# Patient Record
Sex: Female | Born: 1965 | Race: White | Hispanic: No | Marital: Married | Smoking: Never smoker
Health system: Southern US, Community
[De-identification: ages and names within clinical notes are randomized; demographics above are authoritative.]

## PROBLEM LIST (undated history)

## (undated) DIAGNOSIS — M6289 Other specified disorders of muscle: Secondary | ICD-10-CM

## (undated) DIAGNOSIS — E039 Hypothyroidism, unspecified: Secondary | ICD-10-CM

## (undated) DIAGNOSIS — E871 Hypo-osmolality and hyponatremia: Secondary | ICD-10-CM

## (undated) HISTORY — PX: TONSILLECTOMY: SUR1361

## (undated) HISTORY — PX: LASER LAPAROSCOPY: SHX1952

## (undated) HISTORY — PX: ABDOMINAL HYSTERECTOMY: SHX81

## (undated) HISTORY — PX: COLONOSCOPY WITH ESOPHAGOGASTRODUODENOSCOPY (EGD): SHX5779

## (undated) HISTORY — PX: TUBAL LIGATION: SHX77

---

## 2008-07-02 DIAGNOSIS — E063 Autoimmune thyroiditis: Secondary | ICD-10-CM

## 2008-07-02 HISTORY — DX: Autoimmune thyroiditis: E06.3

## 2010-08-30 ENCOUNTER — Ambulatory Visit: Payer: Self-pay | Admitting: Unknown Physician Specialty

## 2010-09-01 LAB — PATHOLOGY REPORT

## 2010-09-28 ENCOUNTER — Ambulatory Visit: Payer: Self-pay | Admitting: Unknown Physician Specialty

## 2011-02-17 DIAGNOSIS — K589 Irritable bowel syndrome without diarrhea: Secondary | ICD-10-CM | POA: Insufficient documentation

## 2011-02-17 DIAGNOSIS — J301 Allergic rhinitis due to pollen: Secondary | ICD-10-CM | POA: Insufficient documentation

## 2011-05-22 DIAGNOSIS — E8940 Asymptomatic postprocedural ovarian failure: Secondary | ICD-10-CM | POA: Insufficient documentation

## 2012-02-11 DIAGNOSIS — M064 Inflammatory polyarthropathy: Secondary | ICD-10-CM | POA: Insufficient documentation

## 2013-12-23 DIAGNOSIS — R05 Cough: Secondary | ICD-10-CM | POA: Insufficient documentation

## 2013-12-23 DIAGNOSIS — R12 Heartburn: Secondary | ICD-10-CM | POA: Insufficient documentation

## 2013-12-23 DIAGNOSIS — R053 Chronic cough: Secondary | ICD-10-CM | POA: Insufficient documentation

## 2014-01-06 ENCOUNTER — Ambulatory Visit: Payer: Self-pay | Admitting: Gastroenterology

## 2014-03-03 ENCOUNTER — Ambulatory Visit: Payer: Self-pay | Admitting: Gastroenterology

## 2016-06-08 DIAGNOSIS — E559 Vitamin D deficiency, unspecified: Secondary | ICD-10-CM | POA: Insufficient documentation

## 2016-06-08 DIAGNOSIS — E039 Hypothyroidism, unspecified: Secondary | ICD-10-CM | POA: Insufficient documentation

## 2016-09-21 DIAGNOSIS — E871 Hypo-osmolality and hyponatremia: Secondary | ICD-10-CM | POA: Insufficient documentation

## 2017-07-31 DIAGNOSIS — E079 Disorder of thyroid, unspecified: Secondary | ICD-10-CM | POA: Insufficient documentation

## 2017-07-31 DIAGNOSIS — R5382 Chronic fatigue, unspecified: Secondary | ICD-10-CM | POA: Insufficient documentation

## 2017-07-31 DIAGNOSIS — M533 Sacrococcygeal disorders, not elsewhere classified: Secondary | ICD-10-CM | POA: Insufficient documentation

## 2017-07-31 DIAGNOSIS — R6889 Other general symptoms and signs: Secondary | ICD-10-CM | POA: Insufficient documentation

## 2017-07-31 DIAGNOSIS — R634 Abnormal weight loss: Secondary | ICD-10-CM | POA: Insufficient documentation

## 2017-07-31 DIAGNOSIS — R768 Other specified abnormal immunological findings in serum: Secondary | ICD-10-CM | POA: Insufficient documentation

## 2017-11-22 ENCOUNTER — Other Ambulatory Visit: Payer: Self-pay | Admitting: Physical Medicine and Rehabilitation

## 2017-11-22 DIAGNOSIS — M5416 Radiculopathy, lumbar region: Secondary | ICD-10-CM

## 2017-12-06 ENCOUNTER — Ambulatory Visit
Admission: RE | Admit: 2017-12-06 | Discharge: 2017-12-06 | Disposition: A | Discharge status: Home / Self Care | Payer: Federal, State, Local not specified - PPO | Source: Ambulatory Visit | Attending: Physical Medicine and Rehabilitation | Admitting: Physical Medicine and Rehabilitation

## 2017-12-06 ENCOUNTER — Ambulatory Visit: Payer: Self-pay

## 2017-12-06 DIAGNOSIS — M5416 Radiculopathy, lumbar region: Secondary | ICD-10-CM

## 2017-12-10 ENCOUNTER — Ambulatory Visit: Payer: Self-pay

## 2018-05-19 DIAGNOSIS — J328 Other chronic sinusitis: Secondary | ICD-10-CM | POA: Insufficient documentation

## 2018-10-09 ENCOUNTER — Telehealth: Payer: Self-pay

## 2018-10-09 NOTE — Telephone Encounter (Signed)
Called pt to pre-chart for her e-visit with Dr. Robbie Lis to contact LVM to return call

## 2018-10-13 ENCOUNTER — Telehealth: Payer: Self-pay

## 2018-10-13 ENCOUNTER — Ambulatory Visit: Payer: Federal, State, Local not specified - PPO | Admitting: Gastroenterology

## 2018-10-13 ENCOUNTER — Ambulatory Visit (INDEPENDENT_AMBULATORY_CARE_PROVIDER_SITE_OTHER): Payer: Federal, State, Local not specified - PPO | Admitting: Gastroenterology

## 2018-10-13 DIAGNOSIS — K581 Irritable bowel syndrome with constipation: Secondary | ICD-10-CM | POA: Diagnosis not present

## 2018-10-13 DIAGNOSIS — R140 Abdominal distension (gaseous): Secondary | ICD-10-CM | POA: Diagnosis not present

## 2018-10-13 NOTE — Telephone Encounter (Signed)
Called Vanessa Gillespie to discuss Dr. Johnney Killian directions to give Vanessa Gillespie Trulance samples  Unable to contact LVM to return call

## 2018-10-13 NOTE — Telephone Encounter (Signed)
Patient called back after missing call.

## 2018-10-13 NOTE — Telephone Encounter (Signed)
Called pt to pre-chart for her e-visit today with Dr. Robbie Lis to contact LVM to return call

## 2018-10-13 NOTE — Telephone Encounter (Signed)
Pt has been informed of Dr. Johnney Killian directions to pick up Trulance samples

## 2018-10-13 NOTE — Progress Notes (Signed)
Vanessa MoodKiran Nioma Gillespie  [address]  [address]  [place], [place] [id number]  Main: [phone]  Fax: [phone]   Gastroenterology Consultation  Referring Provider:     Marlana SalvageShapely-Quinn, Todd Wes* Primary Care Physician:  Nonda LouShapely-Quinn, Todd [place], MD Reason for Consultation:     IBS        HPI:   Virtual Visit via video  Note  I connected with patient on 10/13/18 at 11:00 AM EDT by video  and verified that I am speaking with the correct person using two identifiers.   I discussed the limitations, risks, security and privacy concerns of performing an evaluation and management service by video and the availability of in person appointments. I also discussed with the patient that there may be a patient responsible charge related to this service. The patient expressed understanding and agreed to proceed.  Location of the patient: Home Location of provider: Home Participating persons: Patient and provider only   History of Present Illness: Chief Complaint  Patient presents with  . Irritable Bowel Syndrome     Vanessa Gillespie is a 53 y.o. y/o female referred for consultation & management  by Dr. Perley JainShapely-Quinn, Desiree Lucyodd [place], MD.    She says that she has had IBS-Lately constipation, has gone back and forth with diarrhea for a long time and last year was diagnosed with a "weak pelvic floor" , working with PT, still very weak, she tried a lot of Rx but cant find one that works " all the time", she weights 93 lbs and says she is working with nutrition and tell that when she eats during the day geta a lot of bloating and cant pass the gas. Tried gasex and IB guard which has not worked.   Says in the center of her abdomen has a lot of pressure.   Presently she has a bowel movement only when she takes motegrity daily , MOM, 2 tbsp daily -with this has a bowel movement twice week, bloating is worse when she has not had a bowel movement. When she passes gas is foul smelling.No artificial  sugars, no sodas, chewing gum. No coffee mate.   Tried linzess caused explosive diarrhea in the beginning along with motegrity - she then stopped motegrity and linzess stopped working. Long time back tried [place].   She has had a hysterectomy , 2 kids , vaginal deliveries, large tear with second baby. Sometimes passing urine is hard. No bulging noted from her vagina. 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 Lot of gurgling sounds in her belly . Worse 3-4 hours after eating.   No past medical history on file.    Prior to Admission medications   Medication Sig Start Date End Date Taking? Authorizing Provider  doxepin (SINEQUAN) 10 MG capsule  10/12/18  Yes [provider]  estradiol (CLIMARA - DOSED IN MG/24 HR) 0.075 mg/24hr patch Place onto the skin.   Yes [provider]  levothyroxine (SYNTHROID, LEVOTHROID) 50 MCG tablet Take by mouth. 09/14/18 12/13/18 Yes [provider]  polyethylene glycol (MIRALAX / GLYCOLAX) 17 g packet Take by mouth.   Yes [provider]  URE-NA 15 g PACK USE TAKE 15 GRAMS BY MOUTH ONCE DAILY 08/11/18  Yes [provider]    No family history on file.   Social History   Tobacco Use  . Smoking status: Not on file  Substance Use Topics  . Alcohol use: Not on file  . Drug use: Not  on file    Allergies as of 10/13/2018  . (No Known Allergies)    Review of Systems:    All systems reviewed and negative except where noted in HPI. General Appearance:    Alert, cooperative, no distress, appears stated age  Head:    Normocephalic, without obvious abnormality, atraumatic  Eyes:    PERRL, conjunctiva/corneas clear,  Ears:    Grossly normal hearing    Neurologic:   Grossly appears normal     Observations/Objective:  Labs: CBC No results found for: WBC, RBC, HGB, HCT, PLT, MCV, MCH, MCHC, RDW, LYMPHSABS, MONOABS, EOSABS, BASOSABS CMP  No results found for: NA, K, CL, CO2, GLUCOSE, BUN, CREATININE,  CALCIUM, PROT, ALBUMIN, AST, ALT, ALKPHOS, BILITOT, GFRNONAA, GFRAA  Imaging Studies: No results found.  Assessment and Plan:   Vanessa Gillespie is a 53 y.o. y/o female has been referred for IBS-predominently constipation, H/o hysterectomy and vaginal tear during child birth, in addition states she has a vaginal prolapse- all of these likely contributing to constipation. In addition has features of possible SIBO    Plan :   1. Low FODMAP diet  2. Trial of activatec charcoal 3. Stop motegrity and MOM- start Trulance 4. If no better in 2 weeks, check TSH,celiac serology and discuss colonoscopy   Follow Up Instructions:   I discussed the assessment and treatment plan with the patient. The patient was provided an opportunity to ask questions and all were answered. The patient agreed with the plan and demonstrated an understanding of the instructions.   The patient was advised to call back or seek an in-person evaluation if the symptoms worsen or if the condition fails to improve as anticipated.  I provided 25 minutes of face-to-face time during this encounter.   Dr Vanessa Mood MD,MRCP [facility]) Gastroenterology/Hepatology Pager: [phone]   Speech recognition software was used to dictate the above note.

## 2018-10-20 ENCOUNTER — Other Ambulatory Visit: Payer: Self-pay

## 2018-10-20 MED ORDER — LACTULOSE 10 GM/15ML PO SOLN: 10.0000 g | Freq: Two times a day (BID) | ORAL | 0 refills | Status: DC

## 2018-10-23 NOTE — Telephone Encounter (Signed)
Vanessa Gillespie is OTC - can get at any store like [facility] or [facility]- 17.2 mg once daily   Change follow up to 6 weeks from now    Dr Tobi Bastos

## 2018-10-24 ENCOUNTER — Telehealth: Payer: Self-pay | Admitting: Gastroenterology

## 2018-10-24 NOTE — Telephone Encounter (Signed)
Patient called & Dr Tobi Bastos was told to buy SENNA  For constipation. She purchased Senokot . Is this correct. What doseage  Should she take. Please call & advise.

## 2018-10-24 NOTE — Telephone Encounter (Signed)
Spoke with pt and explained Dr. Johnney Killian directions for taking the Senna(Senokot). Pt understands.

## 2018-10-27 ENCOUNTER — Ambulatory Visit: Payer: Federal, State, Local not specified - PPO | Admitting: Gastroenterology

## 2018-11-03 ENCOUNTER — Other Ambulatory Visit: Payer: Self-pay

## 2018-11-03 MED ORDER — LINACLOTIDE 145 MCG PO CAPS: 145.0000 ug | ORAL_CAPSULE | Freq: Every day | ORAL | 3 refills | Status: DC

## 2018-11-04 ENCOUNTER — Telehealth: Payer: Self-pay | Admitting: Gastroenterology

## 2018-11-04 NOTE — Telephone Encounter (Signed)
[employer] faxed from the [facility] Clinical Call Center message: The Prior Authorization request has been approved for linaclotide (LINZESS) 145 MCG CAPS capsule.The authorization is valid from 10/04/2018 through 11/03/2019. Copy on Vanessa Gillespie's desk.

## 2018-11-04 NOTE — Telephone Encounter (Signed)
Pt left vm she states she wanted to pick up a sample.(pt did not leave more information requests a call in vm)

## 2018-11-11 ENCOUNTER — Ambulatory Visit: Payer: Federal, State, Local not specified - PPO | Admitting: Gastroenterology

## 2018-11-27 ENCOUNTER — Other Ambulatory Visit: Payer: Self-pay

## 2018-11-27 ENCOUNTER — Telehealth: Payer: Self-pay

## 2018-11-27 DIAGNOSIS — Z1211 Encounter for screening for malignant neoplasm of colon: Secondary | ICD-10-CM

## 2018-11-27 MED ORDER — NA SULFATE-K SULFATE-MG SULF 17.5-3.13-1.6 GM/177ML PO SOLN: 1.0000 | Freq: Once | ORAL | 0 refills | Status: AC

## 2018-11-27 NOTE — Progress Notes (Signed)
Pt has been scheduled for a colonoscopy procedure. Pt is aware of requirements to have the COVID-19 test performed 4 days prior to the scheduled procedure. Pt is aware of testing site location, day, and time.

## 2018-11-27 NOTE — Telephone Encounter (Signed)
Spoke with pt and was able to schedule procedure. 

## 2018-11-27 NOTE — Telephone Encounter (Signed)
Called pt to schedule colonoscopy.  Unable to contact, LVM to return call 

## 2018-11-27 NOTE — Telephone Encounter (Signed)
Pt is calling to schedule a colonoscopy  Please call cell #

## 2018-11-27 NOTE — Telephone Encounter (Signed)
-----   Message from [name], MD sent at 11/27/2018  9:40 AM EDT ----- Regarding: colonoscopy Please schedule screening colonoscopy for her

## 2018-12-02 NOTE — Telephone Encounter (Signed)
Spoke with pt and informed her that we do not have a urogynecologist within [facility], pt states she's okay with going back to her original urogynecologist. I also informed pt that we have a Suprep bowel kit sample ready for her to pick up here at the office.

## 2018-12-08 ENCOUNTER — Ambulatory Visit: Payer: Federal, State, Local not specified - PPO | Admitting: Gastroenterology

## 2018-12-09 ENCOUNTER — Telehealth: Payer: Self-pay

## 2018-12-09 ENCOUNTER — Other Ambulatory Visit: Payer: Self-pay

## 2018-12-09 DIAGNOSIS — Z01812 Encounter for preprocedural laboratory examination: Secondary | ICD-10-CM

## 2018-12-09 NOTE — Telephone Encounter (Signed)
Spoke with pt and informed her of Dr. Georgeann Oppenheim instructions for pt to [facility] lab test to check sodium levels 2 days prior to procedure. Pt agrees and is aware she'll need to visit her local [facility] collection site.

## 2018-12-09 NOTE — Telephone Encounter (Signed)
-----   Message from Jonathon Bellows, MD sent at 12/08/2018 11:16 AM EDT ----- Regarding: please arrange appointment  Vanessa Gillespie  Advise her to have her BMP checked 2 days prior to procedure to check sodium levels    Dr Jonathon Bellows  Gastroenterology/Hepatology Pager: [phone]

## 2018-12-12 ENCOUNTER — Other Ambulatory Visit: Payer: Self-pay

## 2018-12-12 ENCOUNTER — Other Ambulatory Visit
Admission: RE | Admit: 2018-12-12 | Discharge: 2018-12-12 | Disposition: A | Discharge status: Home / Self Care | Payer: Federal, State, Local not specified - PPO | Source: Home / Self Care | Attending: Gastroenterology | Admitting: Gastroenterology

## 2018-12-12 ENCOUNTER — Other Ambulatory Visit
Admission: RE | Admit: 2018-12-12 | Discharge: 2018-12-12 | Disposition: A | Discharge status: Home / Self Care | Payer: Federal, State, Local not specified - PPO | Source: Ambulatory Visit | Attending: Gastroenterology | Admitting: Gastroenterology

## 2018-12-12 DIAGNOSIS — Z01812 Encounter for preprocedural laboratory examination: Secondary | ICD-10-CM | POA: Insufficient documentation

## 2018-12-12 DIAGNOSIS — Z1159 Encounter for screening for other viral diseases: Secondary | ICD-10-CM | POA: Diagnosis not present

## 2018-12-12 LAB — BASIC METABOLIC PANEL
Anion gap: 8 (ref 5–15)
BUN: 39 mg/dL — ABNORMAL HIGH (ref 6–20)
CO2: 28 mmol/L (ref 22–32)
Calcium: 9.2 mg/dL (ref 8.9–10.3)
Chloride: 99 mmol/L (ref 98–111)
Creatinine, Ser: 0.68 mg/dL (ref 0.44–1.00)
GFR calc Af Amer: >60 mL/min (ref >60)
GFR calc non Af Amer: >60 mL/min (ref >60)
Glucose, Bld: 92 mg/dL (ref 70–99)
Potassium: 4.7 mmol/L (ref 3.5–5.1)
Sodium: 135 mmol/L (ref 135–145)

## 2018-12-13 LAB — NOVEL CORONAVIRUS, NAA (HOSP ORDER, SEND-OUT TO REF LAB; TAT 18-24 HRS): SARS-CoV-2, NAA: NOT DETECTED

## 2018-12-15 ENCOUNTER — Encounter: Payer: Self-pay | Admitting: Gastroenterology

## 2018-12-15 NOTE — Telephone Encounter (Signed)
Spoke with pt and informed her that her sodium levels are normal and Dr. Vicente Males says pt is okay to proceed with her scheduled endoscopy procedure.

## 2018-12-16 ENCOUNTER — Other Ambulatory Visit: Payer: Self-pay

## 2018-12-16 ENCOUNTER — Ambulatory Visit: Payer: Federal, State, Local not specified - PPO | Admitting: Anesthesiology

## 2018-12-16 ENCOUNTER — Ambulatory Visit
Admission: RE | Admit: 2018-12-16 | Discharge: 2018-12-16 | Disposition: A | Discharge status: Home / Self Care | Payer: Federal, State, Local not specified - PPO | Attending: Gastroenterology | Admitting: Gastroenterology

## 2018-12-16 ENCOUNTER — Encounter
Admission: RE | Disposition: A | Discharge status: Home / Self Care | Payer: Self-pay | Source: Home / Self Care | Attending: Gastroenterology

## 2018-12-16 ENCOUNTER — Encounter: Payer: Self-pay | Admitting: *Deleted

## 2018-12-16 DIAGNOSIS — Z79890 Hormone replacement therapy (postmenopausal): Secondary | ICD-10-CM | POA: Diagnosis not present

## 2018-12-16 DIAGNOSIS — Z09 Encounter for follow-up examination after completed treatment for conditions other than malignant neoplasm: Secondary | ICD-10-CM | POA: Insufficient documentation

## 2018-12-16 DIAGNOSIS — Z860100 Personal history of colon polyps, unspecified: Secondary | ICD-10-CM

## 2018-12-16 DIAGNOSIS — Z1211 Encounter for screening for malignant neoplasm of colon: Secondary | ICD-10-CM

## 2018-12-16 DIAGNOSIS — Z79899 Other long term (current) drug therapy: Secondary | ICD-10-CM | POA: Insufficient documentation

## 2018-12-16 HISTORY — DX: Other specified disorders of muscle: M62.89

## 2018-12-16 HISTORY — PX: COLONOSCOPY WITH PROPOFOL: SHX5780

## 2018-12-16 HISTORY — DX: Hypo-osmolality and hyponatremia: E87.1

## 2018-12-16 HISTORY — DX: Hypothyroidism, unspecified: E03.9

## 2018-12-16 SURGERY — COLONOSCOPY WITH PROPOFOL
Anesthesia: General

## 2018-12-16 MED ORDER — MIDAZOLAM HCL 2 MG/2ML IJ SOLN
INTRAMUSCULAR | Status: DC | PRN
  Administered 2018-12-16: 2 mg via INTRAVENOUS

## 2018-12-16 MED ORDER — MIDAZOLAM HCL 2 MG/2ML IJ SOLN
INTRAMUSCULAR | Status: AC
  Filled 2018-12-16: qty 2

## 2018-12-16 MED ORDER — PROPOFOL 500 MG/50ML IV EMUL
INTRAVENOUS | Status: DC | PRN
  Administered 2018-12-16: 100 ug/kg/min via INTRAVENOUS

## 2018-12-16 MED ORDER — PROPOFOL 500 MG/50ML IV EMUL
INTRAVENOUS | Status: AC
  Filled 2018-12-16: qty 50

## 2018-12-16 MED ORDER — EPHEDRINE SULFATE 50 MG/ML IJ SOLN
INTRAMUSCULAR | Status: AC
  Filled 2018-12-16: qty 1

## 2018-12-16 MED ORDER — SODIUM CHLORIDE 0.9 % IV SOLN
INTRAVENOUS | Status: DC
  Administered 2018-12-16: 07:00:00 via INTRAVENOUS

## 2018-12-16 MED ORDER — FENTANYL CITRATE (PF) 100 MCG/2ML IJ SOLN
INTRAMUSCULAR | Status: DC | PRN
  Administered 2018-12-16: 50 ug via INTRAVENOUS

## 2018-12-16 MED ORDER — EPHEDRINE SULFATE 50 MG/ML IJ SOLN
INTRAMUSCULAR | Status: DC | PRN
  Administered 2018-12-16 (×2): 5 mg via INTRAVENOUS

## 2018-12-16 MED ORDER — FENTANYL CITRATE (PF) 100 MCG/2ML IJ SOLN
INTRAMUSCULAR | Status: AC
  Filled 2018-12-16: qty 2

## 2018-12-16 NOTE — Transfer of Care (Signed)
Immediate Anesthesia Transfer of Care Note  Patient: Vanessa Gillespie  Procedure(s) Performed: COLONOSCOPY WITH PROPOFOL (N/A )  Patient Location: PACU  Anesthesia Type:General  Level of Consciousness: awake and sedated  Airway & Oxygen Therapy: Patient Spontanous Breathing and Patient connected to nasal cannula oxygen  Post-op Assessment: Report given to RN and Post -op Vital signs reviewed and stable  Post vital signs: Reviewed and stable  Last Vitals:  Vitals Value Taken Time  BP 104/66 12/16/18 0837  Temp    Pulse 67 12/16/18 0838  Resp 18 12/16/18 0838  SpO2 100 % 12/16/18 0838  Vitals shown include unvalidated device data.  Last Pain:  Vitals:   12/16/18 0706  TempSrc: Tympanic  PainSc: 0-No pain         Complications: No apparent anesthesia complications

## 2018-12-16 NOTE — H&P (Signed)
Jonathon Bellows, MD [address], [place], [place], [place], [address] [address], [place], [place], [place], [address] Phone: [phone]  Fax: [phone]  Primary Care Physician:  Cecile Sheerer, MD   Pre-Procedure History & Physical: HPI:  Vanessa Gillespie is a 53 y.o. female is here for an colonoscopy.   Past Medical History:  Diagnosis Date  . Hyponatremia   . Hypothyroidism   . Pelvic floor dysfunction     Past Surgical History:  Procedure Laterality Date  . ABDOMINAL HYSTERECTOMY    . COLONOSCOPY WITH ESOPHAGOGASTRODUODENOSCOPY (EGD)    . LASER LAPAROSCOPY    . TONSILLECTOMY    . TUBAL LIGATION      Prior to Admission medications   Medication Sig Start Date End Date Taking? Authorizing Provider  doxepin (SINEQUAN) 10 MG capsule  10/12/18  Yes [provider]  estradiol (CLIMARA - DOSED IN MG/24 HR) 0.075 mg/24hr patch Place onto the skin.   Yes [provider]  lactulose (CHRONULAC) 10 GM/15ML solution Take 15 mLs (10 g total) by mouth 2 (two) times daily. 10/20/18  Yes Jonathon Bellows, MD  linaclotide [facility]) 145 MCG CAPS capsule Take 1 capsule (145 mcg total) by mouth daily before breakfast. 11/03/18  Yes Jonathon Bellows, MD  polyethylene glycol (MIRALAX / GLYCOLAX) 17 g packet Take by mouth.   Yes [provider]  URE-NA 15 g PACK USE TAKE 15 GRAMS BY MOUTH ONCE DAILY 08/11/18  Yes [provider]  levothyroxine (SYNTHROID, LEVOTHROID) 50 MCG tablet Take by mouth. 09/14/18 12/13/18  [provider]    Allergies as of 11/27/2018  . (No Known Allergies)    History reviewed. No pertinent family history.  Social History   Socioeconomic History  . Marital status: Married    Spouse name: Not on file  . Number of children: Not on file  . Years of education: Not on file  . Highest education level: Not on file  Occupational History  . Not on file  Social Needs  . Financial resource strain: Not on file  . Food  insecurity    Worry: Not on file    Inability: Not on file  . Transportation needs    Medical: Not on file    Non-medical: Not on file  Tobacco Use  . Smoking status: Not on file  Substance and Sexual Activity  . Alcohol use: Not on file  . Drug use: Not on file  . Sexual activity: Not on file  Lifestyle  . Physical activity    Days per week: Not on file    Minutes per session: Not on file  . Stress: Not on file  Relationships  . Social Herbalist on phone: Not on file    Gets together: Not on file    Attends religious service: Not on file    Active member of club or organization: Not on file    Attends meetings of clubs or organizations: Not on file    Relationship status: Not on file  . Intimate partner violence    Fear of current or ex partner: Not on file    Emotionally abused: Not on file    Physically abused: Not on file    Forced sexual activity: Not on file  Other Topics Concern  . Not on file  Social History Narrative  . Not on file    Review of Systems: See HPI, otherwise negative ROS  Physical Exam: BP 115/69  Pulse (!) 57   Temp (!) 96.1 F (35.6 C) (Tympanic)   Resp 18   Ht 5\' 2"  (1.575 m)   Wt 42.2 kg   LMP 07/02/2018   SpO2 100%   BMI 17.01 kg/m  General:   Alert,  pleasant and cooperative in NAD Head:  Normocephalic and atraumatic. Neck:  Supple; no masses or thyromegaly. Lungs:  Clear throughout to auscultation, normal respiratory effort.    Heart:  +S1, +S2, Regular rate and rhythm, No edema. Abdomen:  Soft, nontender and nondistended. Normal bowel sounds, without guarding, and without rebound.   Neurologic:  Alert and  oriented x4;  grossly normal neurologically.  Impression/Plan: Vanessa Gillespie is here for an colonoscopy to be performed for surveillance due to prior history of colon polyps   Risks, benefits, limitations, and alternatives regarding  colonoscopy have been reviewed with the patient.  Questions have been  answered.  All parties agreeable.   Wyline MoodKiran Bettylee Feig, MD  12/16/2018, 8:07 AM

## 2018-12-16 NOTE — Anesthesia Procedure Notes (Signed)
Performed by: Cook-Martin, Kasandra Fehr Pre-anesthesia Checklist: Patient identified, Emergency Drugs available, Suction available, Patient being monitored and Timeout performed Patient Re-evaluated:Patient Re-evaluated prior to induction Oxygen Delivery Method: Nasal cannula Preoxygenation: Pre-oxygenation with 100% oxygen Induction Type: IV induction Placement Confirmation: positive ETCO2 and CO2 detector       

## 2018-12-16 NOTE — Anesthesia Postprocedure Evaluation (Signed)
Anesthesia Post Note  Patient: [name]  Procedure(s) Performed: COLONOSCOPY WITH PROPOFOL (N/A )  Patient location during evaluation: PACU Anesthesia Type: General Level of consciousness: awake and alert Pain management: pain level controlled Vital Signs Assessment: post-procedure vital signs reviewed and stable Respiratory status: spontaneous breathing, nonlabored ventilation and respiratory function stable Cardiovascular status: blood pressure returned to baseline and stable Postop Assessment: no apparent nausea or vomiting Anesthetic complications: no     Last Vitals:  Vitals:   12/16/18 0900 12/16/18 0910  BP: 106/67 108/68  Pulse: 64 (!) 59  Resp: 16 14  Temp:    SpO2: 100% 100%    Last Pain:  Vitals:   12/16/18 0830  TempSrc: Tympanic  PainSc:                  Durenda Hurt

## 2018-12-16 NOTE — Anesthesia Post-op Follow-up Note (Signed)
Anesthesia QCDR form completed.        

## 2018-12-16 NOTE — Anesthesia Preprocedure Evaluation (Addendum)
Anesthesia Evaluation  Patient identified by MRN, date of birth, ID band Patient awake    Reviewed: Allergy & Precautions, H&P , NPO status , Patient's Chart, lab work & pertinent test results  Airway Mallampati: II  TM Distance: >3 FB     Dental  (+) Teeth Intact   Pulmonary neg pulmonary ROS,           Cardiovascular negative cardio ROS       Neuro/Psych negative neurological ROS  negative psych ROS   GI/Hepatic negative GI ROS, Neg liver ROS,   Endo/Other  Hypothyroidism   Renal/GU negative Renal ROS  negative genitourinary   Musculoskeletal  (+) Arthritis ,   Abdominal   Peds  Hematology negative hematology ROS (+)   Anesthesia Other Findings Past Medical History: No date: Hyponatremia No date: Hypothyroidism No date: Pelvic floor dysfunction  Past Surgical History: No date: ABDOMINAL HYSTERECTOMY No date: COLONOSCOPY WITH ESOPHAGOGASTRODUODENOSCOPY (EGD) No date: LASER LAPAROSCOPY No date: TONSILLECTOMY No date: TUBAL LIGATION  BMI    Body Mass Index: 17.01 kg/m      Reproductive/Obstetrics negative OB ROS                            Anesthesia Physical Anesthesia Plan  ASA: II  Anesthesia Plan: General   Post-op Pain Management:    Induction:   PONV Risk Score and Plan: Propofol infusion and TIVA  Airway Management Planned: Natural Airway and Nasal Cannula  Additional Equipment:   Intra-op Plan:   Post-operative Plan:   Informed Consent: I have reviewed the patients History and Physical, chart, labs and discussed the procedure including the risks, benefits and alternatives for the proposed anesthesia with the patient or authorized representative who has indicated his/her understanding and acceptance.     Dental Advisory Given  Plan Discussed with: Anesthesiologist and CRNA  Anesthesia Plan Comments:         Anesthesia Quick Evaluation

## 2018-12-16 NOTE — Op Note (Signed)
[facility] Gastroenterology Patient Name: Vanessa BergeronMelissa Gillespie Procedure Date: 12/16/2018 8:08 AM MRN: [id number] Account #: [id number] Date of Birth: 1965-10-13 Admit Type: Outpatient Age: 53 [facility] 3 Gender: Female Note Status: Finalized Procedure:            Colonoscopy Indications:          High risk colon cancer surveillance: Personal history                        of colonic polyps Providers:            Wyline MoodKiran Ramonda Galyon MD, MD Referring MD:         No Local Md, MD (Referring MD) Medicines:            Monitored Anesthesia Care Complications:        No immediate complications. Procedure:            Pre-Anesthesia Assessment:                       - Prior to the procedure, a History and Physical was                        performed, and patient medications, allergies and                        sensitivities were reviewed. The patient's tolerance of                        previous anesthesia was reviewed.                       - The risks and benefits of the procedure and the                        sedation options and risks were discussed with the                        patient. All questions were answered and informed                        consent was obtained.                       - ASA Grade Assessment: II - A patient with mild                        systemic disease.                       After obtaining informed consent, the colonoscope was                        passed under direct vision. Throughout the procedure,                        the patient's blood pressure, pulse, and oxygen                        saturations were monitored continuously. The  Colonoscope was introduced through the anus and                        advanced to the the cecum, identified by the                        appendiceal orifice, IC valve and transillumination.                        The colonoscopy was performed without difficulty. The                 patient tolerated the procedure well. The quality of                        the bowel preparation was adequate. Findings:      The perianal exam findings include poor anal tone      The entire examined colon appeared normal on direct and retroflexion       views. Impression:           - Abnormal perianal exam.                       - The entire examined colon is normal on direct and                        retroflexion views.                       - No specimens collected. Recommendation:       - Discharge patient to home (with escort).                       - Resume previous diet.                       - Continue present medications.                       - Repeat colonoscopy in 5 years for surveillance.                       - Return to my office in 6 weeks. Procedure Code(s):    --- Professional ---                       [phone], Colonoscopy, flexible; diagnostic, including                        collection of specimen(s) by brushing or washing, when                        performed (separate procedure) Diagnosis Code(s):    --- Professional ---                       Z86.010, Personal history of colonic polyps CPT copyright 2019 American Medical Association. All rights reserved. The codes documented in this report are preliminary and upon coder review may  be revised to meet current compliance requirements. Wyline MoodKiran Dossie Swor, MD Wyline MoodKiran Johnel Yielding MD, MD 12/16/2018 8:35:02 AM This report has been signed electronically. Number of Addenda: 0 Note Initiated On: 12/16/2018 8:08 AM Scope Withdrawal Time: 0 hours 14 minutes  56 seconds  Total Procedure Duration: 0 hours 21 minutes 33 seconds  Estimated Blood Loss: Estimated blood loss: none.      [facility]

## 2018-12-17 ENCOUNTER — Encounter: Payer: Self-pay | Admitting: Gastroenterology

## 2018-12-25 ENCOUNTER — Ambulatory Visit: Payer: Federal, State, Local not specified - PPO | Admitting: Gastroenterology

## 2019-01-26 ENCOUNTER — Other Ambulatory Visit: Payer: Self-pay

## 2019-01-26 ENCOUNTER — Telehealth: Payer: Self-pay | Admitting: Gastroenterology

## 2019-01-26 ENCOUNTER — Ambulatory Visit: Payer: Federal, State, Local not specified - PPO | Admitting: Gastroenterology

## 2019-01-26 MED ORDER — LINACLOTIDE 145 MCG PO CAPS: 145.0000 ug | ORAL_CAPSULE | Freq: Every day | ORAL | 5 refills | Status: DC

## 2019-01-26 NOTE — Telephone Encounter (Signed)
Pt left vm she received a message from [facility] stating when her last refill was does she need to do anything please call pt to clarify

## 2019-04-01 NOTE — Telephone Encounter (Signed)
Spoke with pt and informed her that once Dr. Vicente Males signs the form it'll be ready for her to pick up Thursday afternoon. Pt agrees to stop by our office between 3-5 pm.

## 2019-04-21 NOTE — Telephone Encounter (Signed)
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  One of the features of irritable bowel syndrome with constipation is that it does not cause weight loss by itself.  I have not evaluated her for her weight loss from the GI point of view.  If that is something she and her primary care physician feel that needs evaluation and is related to her GI tract then it would require further discussion to decide if she has had all the appropriate testing to rule out other causes of weight loss.   Can you also send the primary care physician a copy of this above message

## 2019-04-30 ENCOUNTER — Other Ambulatory Visit: Payer: Self-pay | Admitting: Gastroenterology

## 2019-06-08 NOTE — Telephone Encounter (Signed)
1. Give samples of motegrity  2. Yes script can be sent to [place] if it works

## 2019-06-09 ENCOUNTER — Other Ambulatory Visit: Payer: Self-pay

## 2019-06-09 MED ORDER — MOTEGRITY 2 MG PO TABS: 2.0000 mg | ORAL_TABLET | Freq: Every day | ORAL | 3 refills | Status: DC

## 2019-06-09 NOTE — Telephone Encounter (Signed)
Hi sent a message to [facility] yesterday.   Yes can try motegrity, give samples. If does work will send script to [place]

## 2019-06-11 MED ORDER — MOTEGRITY 2 MG PO TABS: 2.0000 mg | ORAL_TABLET | Freq: Every day | ORAL | 3 refills | Status: DC

## 2019-06-11 NOTE — Addendum Note (Signed)
Addended by: Dorethea Clan on: 06/11/2019 11:43 AM   Modules accepted: Orders

## 2019-06-11 NOTE — Telephone Encounter (Signed)
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

## 2019-07-06 DIAGNOSIS — H938X2 Other specified disorders of left ear: Secondary | ICD-10-CM | POA: Insufficient documentation

## 2019-07-17 NOTE — Telephone Encounter (Signed)
Can we mail out that one to her ?

## 2019-07-17 NOTE — Telephone Encounter (Signed)
We can try a 7 day course of augmentin for SIBO. Check if has PCN allergies or not and send script

## 2019-07-20 ENCOUNTER — Other Ambulatory Visit: Payer: Self-pay

## 2019-07-20 MED ORDER — AMOXICILLIN-POT CLAVULANATE 875-125 MG PO TABS: 1.0000 | ORAL_TABLET | Freq: Two times a day (BID) | ORAL | 0 refills | Status: AC

## 2019-08-26 ENCOUNTER — Ambulatory Visit: Payer: Federal, State, Local not specified - PPO | Admitting: Gastroenterology

## 2019-09-01 ENCOUNTER — Other Ambulatory Visit: Payer: Self-pay

## 2019-09-01 ENCOUNTER — Ambulatory Visit: Payer: Federal, State, Local not specified - PPO | Admitting: Gastroenterology

## 2019-09-01 VITALS — BP 101/57 | HR 63 | Temp 97.6°F | Ht 62.0 in | Wt 93.6 lb

## 2019-09-01 DIAGNOSIS — R140 Abdominal distension (gaseous): Secondary | ICD-10-CM | POA: Diagnosis not present

## 2019-09-01 DIAGNOSIS — K580 Irritable bowel syndrome with diarrhea: Secondary | ICD-10-CM

## 2019-09-01 DIAGNOSIS — R634 Abnormal weight loss: Secondary | ICD-10-CM

## 2019-09-01 DIAGNOSIS — M6289 Other specified disorders of muscle: Secondary | ICD-10-CM

## 2019-09-01 MED ORDER — RIFAXIMIN 550 MG PO TABS: 550.0000 mg | ORAL_TABLET | Freq: Two times a day (BID) | ORAL | 0 refills | Status: AC

## 2019-09-01 NOTE — Progress Notes (Signed)
[name] MD, MRCP(U.K) [address]  [address]  [place], [place] [address]  Main: [phone]  Fax: [phone]   Primary Care Physician: [name], MD  Primary Gastroenterologist:  Dr. [name]   No chief complaint on file.   HPI: Vanessa Gillespie is a 54 y.o. female    Summary of history :  Initially and last seen on 10/13/2018 for IBS:  has gone back and forth with diarrhea for a long time and  was diagnosed with a "weak pelvic floor" , lot of bloating and cant pass the gas. Tried gasex and IB guard which has not worked. She has had a hysterectomy , 2 kids , vaginal deliveries, large tear with second baby. Sometimes passing urine is hard. No bulging noted from her vagina. 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 She was having issues with constipation.    Tried linzess caused explosive diarrhea in the beginning along with motegrity - she then stopped motegrity and linzess stopped working. Long time back tried [place].   Lot of gurgling sounds in her belly . Worse 3-4 hours after eating.   No past medical history on file.   Interval history  10/13/2018-09/01/2019  12/16/2018:colonoscopy :poor anal tone otherwise normal.   Underwent Posterior colporrhaphy, perineorrhaphy surgery on 04/27/2019 with Dr. Karis Juba Labs 08/24/2019: Hemoglobin 12.6 g.  CMP normal.  She has been undergoing pelvic floor strengthening therapy with minimal improvement.  Her main symptoms presently is weight loss.  She says that she is losing weight because of her abdominal bloating discomfort and incontinence.  She says that at the end of the day she looks pregnant with abdominal distention.  Has a lot of rumbling noises in her abdomen.  When she passes gas it is foul-smelling.  These features are associated with incontinence of her bowels.  Last night she had up to 10 bowel movements.  She recalls many years back she was given a course of Xifaxan and did not  complete the treatment as she felt bloated.  She suffers from Hashimoto's disease but has not had a thyroid checked recently.  She does consume coffee with sweetener in the mornings.   Current Outpatient Medications  Medication Sig Dispense Refill  . doxepin (SINEQUAN) 10 MG capsule     . estradiol (CLIMARA - DOSED IN MG/24 HR) 0.075 mg/24hr patch Place onto the skin.    Marland Kitchen lactulose (CHRONULAC) 10 GM/15ML solution Take 15 mLs (10 g total) by mouth 2 (two) times daily. 946 mL 0  . levothyroxine (SYNTHROID, LEVOTHROID) 50 MCG tablet Take by mouth.    . linaclotide (LINZESS) 145 MCG CAPS capsule Take 1 capsule (145 mcg total) by mouth daily before breakfast. 30 capsule 5  . polyethylene glycol (MIRALAX / GLYCOLAX) 17 g packet Take by mouth.    . Prucalopride Succinate (MOTEGRITY) 2 MG TABS Take 1 tablet (2 mg total) by mouth daily. 90 tablet 3  . URE-NA 15 g PACK USE TAKE 15 GRAMS BY MOUTH ONCE DAILY     No current facility-administered medications for this visit.    Allergies as of 09/01/2019  . (No Known Allergies)    ROS:  General: Negative for anorexia, weight loss, fever, chills, fatigue, weakness. ENT: Negative for hoarseness, difficulty swallowing , nasal congestion. CV: Negative for chest pain, angina, palpitations, dyspnea on exertion, peripheral edema.  Respiratory: Negative for dyspnea at rest, dyspnea on exertion, cough, sputum, wheezing.  GI: See history of present illness. GU:  Negative for dysuria, hematuria, urinary incontinence, urinary frequency, nocturnal urination.  Endo: Negative for unusual weight change.    Physical Examination:   LMP 07/02/2018   General: Well-nourished, well-developed in no acute distress.  Eyes: No icterus. Conjunctivae pink. Mouth: Oropharyngeal mucosa moist and pink , no lesions erythema or exudate. Lungs: Clear to auscultation bilaterally. Non-labored. Heart: Regular rate and rhythm, no murmurs rubs or gallops.  Abdomen: Bowel sounds  are normal, nontender, nondistended, no hepatosplenomegaly or masses, no abdominal bruits or hernia , no rebound or guarding.   Extremities: No lower extremity edema. No clubbing or deformities. Neuro: Alert and oriented x 3.  Grossly intact. Skin: Warm and dry, no jaundice.   Psych: Alert and cooperative, normal mood and affect.   Imaging Studies: No results found.  Assessment and Plan:   Vanessa Gillespie is a 54 y.o. y/o female here to follow up for  IBS previously was constipation but now appears to be significant diarrhea.  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  She has not responded adequately to pelvic floor strengthening exercises.   Plan :   1. Low FODMAP diet  2. Stop all artificial sugars, daily.  Can try milk with Lactaid. 3. She would like a second opinion at [facility] to see uro-GYN.  The question she would like to asked them would be if there are any other options to improve her pelvic floor tone, increased anal tone, any role to inject the anal area with a substance to improve the tone versus a pudendal nerve stimulator. 4. I will treat her IBS-D with 14 days of Xifaxan.  If not completely resolved can extend to 28 days. 5. Stop Motegrity and other laxatives and use only MiraLAX as needed. 6. Check B12, TSH, folate, food allergy panel. 7. She has dropped off disability forms which I will fill out and send back to her 8.  If weight loss continues and above evaluation is negative may consider CT scan of the chest abdomen and pelvis.   I spent a total of 45 minutes to review her records, summarized review recent tests that I have ordered such as the hydrogen breath test.  Counseled her on her medical problems discuss a management plan, discuss medications complete paperwork for disability   Dr Jonathon Bellows  MD,MRCP [facility]) Follow up in  14 days telephone visit

## 2019-09-04 ENCOUNTER — Encounter: Payer: Self-pay | Admitting: Gastroenterology

## 2019-09-04 LAB — FOOD ALLERGY PROFILE
Allergen Corn, IgE: <0.1 kU/L
Clam IgE: <0.1 kU/L
Codfish IgE: <0.1 kU/L
Egg White IgE: <0.1 kU/L
Milk IgE: <0.1 kU/L
Peanut IgE: <0.1 kU/L
Scallop IgE: <0.1 kU/L
Sesame Seed IgE: <0.1 kU/L
Shrimp IgE: <0.1 kU/L
Soybean IgE: <0.1 kU/L
Walnut IgE: <0.1 kU/L
Wheat IgE: <0.1 kU/L

## 2019-09-04 LAB — B12 AND FOLATE PANEL
Folate: 16 ng/mL (ref >3.0)
Vitamin B-12: 606 pg/mL (ref 232–1245)

## 2019-09-04 LAB — TSH: TSH: 0.98 u[IU]/mL (ref 0.450–4.500)

## 2019-09-10 ENCOUNTER — Encounter: Payer: Self-pay | Admitting: Gastroenterology

## 2019-09-14 ENCOUNTER — Ambulatory Visit (INDEPENDENT_AMBULATORY_CARE_PROVIDER_SITE_OTHER): Payer: Federal, State, Local not specified - PPO | Admitting: Gastroenterology

## 2019-09-14 DIAGNOSIS — K580 Irritable bowel syndrome with diarrhea: Secondary | ICD-10-CM | POA: Diagnosis not present

## 2019-09-14 DIAGNOSIS — R140 Abdominal distension (gaseous): Secondary | ICD-10-CM

## 2019-09-14 NOTE — Progress Notes (Signed)
Vanessa Gillespie , MD [address]  [address]  [place], [place] [address]  Main: [phone]  Fax: [phone]   Primary Care Physician: [name], MD  Virtual Visit via Telephone Note  I connected with patient on 09/14/19 at  1:15 PM EDT by telephone and verified that I am speaking with the correct person using two identifiers.   I discussed the limitations, risks, security and privacy concerns of performing an evaluation and management service by telephone and the availability of in person appointments. I also discussed with the patient that there may be a patient responsible charge related to this service. The patient expressed understanding and agreed to proceed.  Location of Patient: Home Location of Provider: Home Persons involved: Patient and provider only   History of Present Illness:  Follow up for SIBO.  HPI: Vanessa Gillespie is a 54 y.o. female   Summary of history :  Initially and last seen on 10/13/2018 for IBS: has gone back and forth with diarrhea for a long time and  was diagnosed with a "weak pelvic floor" , lot of bloating and cant pass the gas. Tried gasex and IB guard which has not worked. She has had a hysterectomy , 2 kids , vaginal deliveries, large tear with second baby. Sometimes passing urine is hard. No bulging noted from her vagina. 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 She was having issues with constipation. She has been undergoing pelvic floor strengthening therapy with minimal improvement.   Tried linzess caused explosive diarrhea in the beginning along with motegrity - she then stopped motegrity and linzess stopped working. Long time back tried [place].   12/16/2018:colonoscopy :poor anal tone otherwise normal.  Underwent Posterior colporrhaphy, perineorrhaphy surgery on 04/27/2019 with Dr. Karis Juba Labs 08/24/2019: Hemoglobin 12.6 g.  CMP normal.  Lot of gurgling sounds in her belly . Worse 3-4 hours after  eating.  Interval history  09/01/2019-09/14/2019  09/01/2019: Food allergy panel , b12,folate,TSH- normal  Seen by [facility]- has a an appointment for physical therapy with electrical stimulation . Also being referred for sacral stimulator.   Does feel the rifaxamin has helped for bloating On day 13 - has been having small 10 bowel movements has she has difficulty getting it out but not loose.    At her last visit there were concerns for  weight loss.  She says that she is losing weight because of her abdominal bloating discomfort and incontinence.   Current Outpatient Medications  Medication Sig Dispense Refill  . doxepin (SINEQUAN) 10 MG capsule     . estradiol (CLIMARA - DOSED IN MG/24 HR) 0.075 mg/24hr patch Place onto the skin.    Marland Kitchen lactulose (CHRONULAC) 10 GM/15ML solution Take 15 mLs (10 g total) by mouth 2 (two) times daily. 946 mL 0  . levothyroxine (SYNTHROID, LEVOTHROID) 50 MCG tablet Take by mouth.    . linaclotide (LINZESS) 145 MCG CAPS capsule Take 1 capsule (145 mcg total) by mouth daily before breakfast. 30 capsule 5  . polyethylene glycol (MIRALAX / GLYCOLAX) 17 g packet Take by mouth.    . Prucalopride Succinate (MOTEGRITY) 2 MG TABS Take 1 tablet (2 mg total) by mouth daily. 90 tablet 3  . rifaximin (XIFAXAN) 550 MG TABS tablet Take 1 tablet (550 mg total) by mouth 2 (two) times daily for 14 days. 28 tablet 0  . URE-NA 15 g PACK USE TAKE 15 GRAMS BY MOUTH ONCE DAILY  No current facility-administered medications for this visit.    Allergies as of 09/14/2019  . (No Known Allergies)    Review of Systems:    All systems reviewed and negative except where noted in HPI.   Observations/Objective:  Labs: CMP     Component Value Date/Time   NA 135 12/12/2018 1011   K 4.7 12/12/2018 1011   CL 99 12/12/2018 1011   CO2 28 12/12/2018 1011   GLUCOSE 92 12/12/2018 1011   BUN 39 (H) 12/12/2018 1011   CREATININE 0.68 12/12/2018 1011   CALCIUM 9.2 12/12/2018 1011    GFRNONAA >60 12/12/2018 1011   GFRAA >60 12/12/2018 1011   No results found for: WBC, HGB, HCT, MCV, PLT  Imaging Studies: No results found.  Assessment and Plan:   CAROLY PUREWAL is a 54 y.o. y/o femalehere to follow up for  IBS previously was constipation but now appears to be significant diarrhea.  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  She has not responded adequately to pelvic floor strengthening exercises.  Referred to [facility] for a second opinion and it appears that they are considering pelvic floor strengthening exercises with electrical therapy as well as a sacral stimulator.  She has appointments to follow-up on the same.  From my point of view she has been taking Motegrity on and off to help with the bowel movements and presently being treated with Xifaxan for bloating and gas which she has responded to adequately.  Did not discuss about her weight loss issue at this time but will do so at her next visit to ensure that there is no further weight loss.   Plan : 1. Low FODMAP diet  2. If weight loss continues and above evaluation is negative may consider CT scan of the chest abdomen and pelvis. 3.  She will contact me in a couple of days when she completes 2 weeks of Xifaxan if she needs 2 more weeks then I will prescribe it to her as she feels significantly better so far.   I discussed the assessment and treatment plan with the patient. The patient was provided an opportunity to ask questions and all were answered. The patient agreed with the plan and demonstrated an understanding of the instructions.   The patient was advised to call back or seek an in-person evaluation if the symptoms worsen or if the condition fails to improve as anticipated.  I provided 16 minutes of non-face-to-face time during this encounter.  Dr Jonathon Bellows  MD,MRCP [facility]) Gastroenterology/Hepatology Pager: [phone]   Speech recognition software was used to dictate this note.

## 2019-09-17 ENCOUNTER — Other Ambulatory Visit: Payer: Self-pay

## 2019-09-17 MED ORDER — RIFAXIMIN 550 MG PO TABS: 550.0000 mg | ORAL_TABLET | Freq: Two times a day (BID) | ORAL | 0 refills | Status: DC

## 2019-09-17 NOTE — Telephone Encounter (Signed)
Send script for 30 days of xifaxan . Can we give her the details for COVID vaccine sign up?

## 2019-10-07 DIAGNOSIS — R159 Full incontinence of feces: Secondary | ICD-10-CM | POA: Insufficient documentation

## 2019-10-20 DIAGNOSIS — K580 Irritable bowel syndrome with diarrhea: Secondary | ICD-10-CM

## 2019-10-20 DIAGNOSIS — R140 Abdominal distension (gaseous): Secondary | ICD-10-CM

## 2019-10-26 ENCOUNTER — Other Ambulatory Visit: Payer: Self-pay

## 2019-10-26 DIAGNOSIS — K580 Irritable bowel syndrome with diarrhea: Secondary | ICD-10-CM

## 2019-10-26 DIAGNOSIS — R140 Abdominal distension (gaseous): Secondary | ICD-10-CM

## 2019-10-28 ENCOUNTER — Other Ambulatory Visit: Payer: Self-pay

## 2019-10-28 LAB — COMPREHENSIVE METABOLIC PANEL
ALT: 30 IU/L (ref 0–32)
AST: 32 IU/L (ref 0–40)
Albumin/Globulin Ratio: 2.1 (ref 1.2–2.2)
Albumin: 4.4 g/dL (ref 3.8–4.9)
Alkaline Phosphatase: 46 IU/L (ref 39–117)
BUN/Creatinine Ratio: 28 — ABNORMAL HIGH (ref 9–23)
BUN: 21 mg/dL (ref 6–24)
Bilirubin Total: 0.3 mg/dL (ref 0.0–1.2)
CO2: 24 mmol/L (ref 20–29)
Calcium: 9.1 mg/dL (ref 8.7–10.2)
Chloride: 96 mmol/L (ref 96–106)
Creatinine, Ser: 0.74 mg/dL (ref 0.57–1.00)
GFR calc Af Amer: 108 mL/min/{1.73_m2} (ref >59)
GFR calc non Af Amer: 93 mL/min/{1.73_m2} (ref >59)
Globulin, Total: 2.1 g/dL (ref 1.5–4.5)
Glucose: 92 mg/dL (ref 65–99)
Potassium: 4.3 mmol/L (ref 3.5–5.2)
Sodium: 134 mmol/L (ref 134–144)
Total Protein: 6.5 g/dL (ref 6.0–8.5)

## 2019-10-28 MED ORDER — RIFAXIMIN 550 MG PO TABS: 550.0000 mg | ORAL_TABLET | Freq: Two times a day (BID) | ORAL | 0 refills | Status: DC

## 2019-10-28 MED ORDER — NEOMYCIN SULFATE 500 MG PO TABS: 500.0000 mg | ORAL_TABLET | Freq: Two times a day (BID) | ORAL | 0 refills | Status: AC

## 2019-11-02 ENCOUNTER — Other Ambulatory Visit: Payer: Self-pay

## 2019-11-02 DIAGNOSIS — K580 Irritable bowel syndrome with diarrhea: Secondary | ICD-10-CM

## 2019-11-10 LAB — COMPREHENSIVE METABOLIC PANEL
ALT: 24 IU/L (ref 0–32)
AST: 25 IU/L (ref 0–40)
Albumin/Globulin Ratio: 2.3 — ABNORMAL HIGH (ref 1.2–2.2)
Albumin: 4.3 g/dL (ref 3.8–4.9)
Alkaline Phosphatase: 43 IU/L (ref 39–117)
BUN/Creatinine Ratio: 22 (ref 9–23)
BUN: 16 mg/dL (ref 6–24)
Bilirubin Total: 0.2 mg/dL (ref 0.0–1.2)
CO2: 25 mmol/L (ref 20–29)
Calcium: 8.9 mg/dL (ref 8.7–10.2)
Chloride: 96 mmol/L (ref 96–106)
Creatinine, Ser: 0.73 mg/dL (ref 0.57–1.00)
GFR calc Af Amer: 110 mL/min/{1.73_m2} (ref >59)
GFR calc non Af Amer: 95 mL/min/{1.73_m2} (ref >59)
Globulin, Total: 1.9 g/dL (ref 1.5–4.5)
Glucose: 90 mg/dL (ref 65–99)
Potassium: 4.3 mmol/L (ref 3.5–5.2)
Sodium: 133 mmol/L — ABNORMAL LOW (ref 134–144)
Total Protein: 6.2 g/dL (ref 6.0–8.5)

## 2019-11-10 NOTE — Progress Notes (Signed)
Inform are normal

## 2019-11-16 ENCOUNTER — Telehealth: Payer: Federal, State, Local not specified - PPO | Admitting: Gastroenterology

## 2019-12-28 ENCOUNTER — Telehealth: Payer: Self-pay | Admitting: Gastroenterology

## 2019-12-28 NOTE — Telephone Encounter (Signed)
Patient called & states she has Sibo & now has acid reflux really bad. What does she need to do? Please advise.

## 2020-01-19 ENCOUNTER — Ambulatory Visit: Payer: Federal, State, Local not specified - PPO | Admitting: Gastroenterology

## 2020-01-25 ENCOUNTER — Other Ambulatory Visit: Payer: Self-pay

## 2020-01-25 DIAGNOSIS — R140 Abdominal distension (gaseous): Secondary | ICD-10-CM

## 2020-01-25 DIAGNOSIS — R634 Abnormal weight loss: Secondary | ICD-10-CM

## 2020-01-25 DIAGNOSIS — K580 Irritable bowel syndrome with diarrhea: Secondary | ICD-10-CM

## 2020-01-25 MED ORDER — RIFAXIMIN 550 MG PO TABS: 550.0000 mg | ORAL_TABLET | Freq: Three times a day (TID) | ORAL | 0 refills | Status: AC

## 2020-01-28 ENCOUNTER — Telehealth: Payer: Self-pay

## 2020-01-28 NOTE — Telephone Encounter (Signed)
Pharmacy sent over she needed a PA on patient Xifaxan 550mg . Cover my meds states NO PA NEEDED XIFAXAN 550MG  126/365 APPROVED 08/19/2019 THRU 09/17/2020 DX IBS-D CONT OF [place]

## 2020-02-17 ENCOUNTER — Encounter: Payer: Self-pay | Admitting: Gastroenterology

## 2020-02-17 LAB — VITAMIN A: Vitamin A: 30.6 ug/dL (ref 20.1–62.0)

## 2020-02-17 LAB — VITAMIN E
Vitamin E (Alpha Tocopherol): 13 mg/L (ref 7.0–25.1)
Vitamin E(Gamma Tocopherol): 0.7 mg/L (ref 0.5–5.5)

## 2020-02-17 LAB — VITAMIN D 1,25 DIHYDROXY
Vitamin D 1, 25 (OH)2 Total: 54 pg/mL
Vitamin D2 1, 25 (OH)2: <10 pg/mL
Vitamin D3 1, 25 (OH)2: 54 pg/mL

## 2020-02-17 LAB — VITAMIN C: Vitamin C: 0.6 mg/dL (ref 0.4–2.0)

## 2020-02-17 LAB — VITAMIN K1, SERUM: VITAMIN K1: 0.24 ng/mL (ref 0.10–2.20)

## 2020-04-18 ENCOUNTER — Other Ambulatory Visit: Payer: Self-pay

## 2020-04-18 MED ORDER — MOTEGRITY 2 MG PO TABS: 2.0000 mg | ORAL_TABLET | Freq: Every day | ORAL | 3 refills | Status: DC

## 2020-10-04 DIAGNOSIS — N3941 Urge incontinence: Secondary | ICD-10-CM | POA: Insufficient documentation

## 2020-10-04 DIAGNOSIS — N9489 Other specified conditions associated with female genital organs and menstrual cycle: Secondary | ICD-10-CM | POA: Insufficient documentation

## 2020-12-31 DIAGNOSIS — K3184 Gastroparesis: Secondary | ICD-10-CM | POA: Insufficient documentation

## 2020-12-31 DIAGNOSIS — E063 Autoimmune thyroiditis: Secondary | ICD-10-CM | POA: Insufficient documentation

## 2020-12-31 DIAGNOSIS — R636 Underweight: Secondary | ICD-10-CM | POA: Insufficient documentation

## 2021-03-08 ENCOUNTER — Telehealth: Payer: Self-pay | Admitting: Gastroenterology

## 2021-03-08 NOTE — Telephone Encounter (Signed)
Pt. Requesting a call about an upcoming appointment pt needs to know if Dr. Tobi Bastos needs her records before she comes and she also she has another question.

## 2021-03-09 NOTE — Telephone Encounter (Signed)
Patient called and stated that she has gastroparesis and have been taking Azithromycin for the past 9 months and she stated that her pharmacist was recommending probiotics. However, she wanted to know if she could start taking them or not. Patient stated that she had several other questions but that she wanted this answered before her appointment with you to continue the ball rolling. Please advise.

## 2021-03-14 NOTE — Telephone Encounter (Signed)
Vanessa Gillespie can we ensure she has a full 30 min visit, she has a complicated history

## 2021-03-14 NOTE — Telephone Encounter (Signed)
Can we bring her in at 8 am so I have full 30 mins till 8.30

## 2021-03-27 ENCOUNTER — Other Ambulatory Visit: Payer: Self-pay

## 2021-03-28 NOTE — Progress Notes (Signed)
[name] MD, MRCP(U.K) [address]  [address]  [place], [place] [address]  Main: [phone]  Fax: [phone]   Primary Care Physician: [name], MD  Primary Gastroenterologist:  Dr. [name]   Chief complaint follow-up for gastroparesis, bacterial overgrowth syndrome, fecal incontinence.  HPI: Vanessa Gillespie is a 55 y.o. female  Summary of history :   Initially and seen on 10/13/2018 for IBS:  has gone back and forth with diarrhea for a long time and  was diagnosed with a "weak pelvic floor" , lot of bloating and cant pass the gas. Tried gasex and IB guard which has not worked. She has had a hysterectomy , 2 kids , vaginal deliveries, large tear with second baby. Sometimes passing urine is hard. No bulging noted from her vagina. 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 She was having issues with constipation. She has been undergoing pelvic floor strengthening therapy with minimal improvement.     Tried linzess caused explosive diarrhea in the beginning along with motegrity - she then stopped motegrity and linzess stopped working. Long time back tried [place].    12/16/2018:colonoscopy :poor anal tone otherwise normal.  Underwent Posterior colporrhaphy, perineorrhaphy surgery on 04/27/2019 with Dr. Karis Juba Labs 08/24/2019: Hemoglobin 12.6 g.  CMP normal.  09/01/2019: Food allergy panel  , b12,folate,TSH- normal     Lot of gurgling sounds in her belly . Worse 3-4 hours after eating.    Interval history 09/14/2019-03/28/2021  After her last visit we identified her to be seen by Dr. Pearlean Brownie throughout.  She was seen and evaluated by him in September 2021.  She had a gastric emptying study small bowel study including the whole colonic transit time study gastric emptying was delayed at 10 hours and 46 minutes.  Small bowel transit time was over 9 hours and the colonic transit time was greater than 98 hours the capsule never passed in over 119  hours.   She also underwent an enteroscopy on 03/16/2020 no evidence of Barrett's esophagus moderate amount of food of bile in the gastric body biopsies were taken in the duodenum Unusual modulator was placed and has taken 2021 with minimal effect.  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    Her Motegrity was stopped and commenced on Linzess for slow transit constipation in addition erythromycin was commenced to speed up gastric emptying.  She was commenced on azithromycin 3 times a day before meals, milk of magnesia nightly.  Subsequently she has reverted back to [employer] then tinidazole for gastric motility as well as bacterial overgrowth syndrome MCT Oil and Citrucel.  Recommended biofeedback for dyssynergy In May 2022 she followed up with [facility] urology had readjusted In July 2022 she tested positive for COVID-19.  03/27/2021 creatinine 0.7, elevated AST and ALT at 70 and 86.  B12 normal, iron studies normal, ferritin 71, TSH normal.  Her main concerns at this point of time is that she is unable to gain weight because she has a lot of bloating gas and abdominal distention.  The Motegrity has been helping with constipation.  She does not feel any better than what she felt before seeing Dr. Smith Robert overall.  She has been on azithromycin taking 3 times a day for over 9 months.  She stopped it  a week back.  No clear plan in terms of long-term use or concerns.  She then brought in a research trial for her fecal incontinence.   Current Outpatient Medications  Medication Sig Dispense Refill   amitriptyline (ELAVIL) 10 MG tablet Take 1 tablet by mouth at bedtime.     Ascorbic Acid (VITAMIN C) POWD Take 1 packet by mouth at bedtime as needed.     azithromycin (ZITHROMAX)  200 MG/5ML suspension Take 200 mg by mouth 3 (three) times daily.     diazepam (VALIUM) 10 MG tablet Take 10 mg by mouth daily as needed.     estradiol (CLIMARA - DOSED IN MG/24 HR) 0.05 mg/24hr patch 0.05 mg once a week.     hydrOXYzine (VISTARIL) 25 MG capsule Take 1 capsule by mouth as needed.     levothyroxine (SYNTHROID) 50 MCG tablet Take 1 tablet by mouth daily.     magnesium hydroxide (MILK OF MAGNESIA) 400 MG/5ML suspension Take 5 mLs by mouth as needed.     Prucalopride Succinate (MOTEGRITY) 2 MG TABS Take 1 tablet (2 mg total) by mouth daily. 90 tablet 3   rifaximin (XIFAXAN) 550 MG TABS tablet Take 1 tablet (550 mg total) by mouth 3 (three) times daily for 14 days. 42 tablet 0   URE-NA 15 g PACK USE TAKE 15 GRAMS BY MOUTH ONCE DAILY     No current facility-administered medications for this visit.    Allergies as of 03/29/2021   (No Known Allergies)    ROS:  General: Negative for anorexia, weight loss, fever, chills, fatigue, weakness. ENT: Negative for hoarseness, difficulty swallowing , nasal congestion. CV: Negative for chest pain, angina, palpitations, dyspnea on exertion, peripheral edema.  Respiratory: Negative for dyspnea at rest, dyspnea on exertion, cough, sputum, wheezing.  GI: See history of present illness. GU:  Negative for dysuria, hematuria, urinary incontinence, urinary frequency, nocturnal urination.  Endo: Negative for unusual weight change.    Physical Examination:   BP 125/74   Pulse 61   Temp 98.3 F (36.8 C) (Oral)   Wt 88 lb (39.9 kg)   LMP 07/02/2018   BMI 16.10 kg/m   General: Well-nourished, well-developed in no acute distress.  Eyes: No icterus. Conjunctivae pink. Extremities: No lower extremity edema. No clubbing or deformities. Neuro: Alert and oriented x 3.  Grossly intact. Skin: Warm and dry, no jaundice.   Psych: Alert and cooperative, normal mood and affect.   Imaging Studies: No results found.  Assessment and Plan:    Vanessa Gillespie is a 55 y.o. 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  Status post InterStim placement at [facility] for pelvic floor dysfunction which is being managed at [facility] presently .  Seen for second opinion by Dr. Elisha Ponder for the above problems and was placed on azithromycin 3 times a day for over 9 months.  Not receiving any benefit.  No other change in plan apart from adding MCT Oil as well.   Recently noted her LFTs were elevated.  1.  Recheck LFTs, GGT, CK.  If LFTs still elevated and not improving then will need full autoimmune and liver work-up.  Including imaging of the liver.A,D,E,K,Zn,Copper  2. She is presently on azithromycin for a long time.  Taking 3 times a day.  Macrolides are associated with tachyphylaxis.  It may be losing its efficacy.  In addition it has effects  on QT prolongation and could be causing abnormal liver function tests.  I am unclear at this point of time the benefits versus the risks of taking the same.  And since she is not having any additional benefit from this medication I have advised her to stop it.  3. Domperidone has been used for improving gastric motility.  There is a black box warning in relation to the same for his cardiac side effects which I have discussed.  I have asked her to read about it at home and think about utilizing it and if she decides to go ahead will obtain expanded access use through the FDA.  It will have to be obtained through [place] subsequently.  4.  I will give her a course of Xifaxan for 14 days for her IBS-D.  Eventually she may need to go on multiple rotating courses of antibiotics.   5.  Eventually I have asked her to contact [facility] clinic and Romeo Apple to see if she can be enrolled in any research study for gastroparesis and small bowel dysmotility.  I will also perform my own research after her  above initial issues have been taken care of to see if we can have her enrolled at the study elsewhere.  6.  She will contact me in 2 weeks after the course of Xifaxan to tell me how she is doing and if she is willing to go ahead with domperidone.  I will schedule a follow-up after that.  I spent over 45 minutes reviewing, summarizing with over 50% of the time spent in counseling.  Dr [name]  MD,MRCP [facility]) Follow up in as needed

## 2021-03-29 ENCOUNTER — Ambulatory Visit: Payer: Federal, State, Local not specified - PPO | Admitting: Gastroenterology

## 2021-03-29 ENCOUNTER — Other Ambulatory Visit: Payer: Self-pay

## 2021-03-29 ENCOUNTER — Encounter: Payer: Self-pay | Admitting: Gastroenterology

## 2021-03-29 VITALS — BP 125/74 | HR 61 | Temp 98.3°F | Wt 88.0 lb

## 2021-03-29 DIAGNOSIS — R634 Abnormal weight loss: Secondary | ICD-10-CM | POA: Diagnosis not present

## 2021-03-29 DIAGNOSIS — R7989 Other specified abnormal findings of blood chemistry: Secondary | ICD-10-CM

## 2021-03-29 DIAGNOSIS — M6289 Other specified disorders of muscle: Secondary | ICD-10-CM

## 2021-03-29 DIAGNOSIS — K3184 Gastroparesis: Secondary | ICD-10-CM | POA: Diagnosis not present

## 2021-03-29 DIAGNOSIS — R945 Abnormal results of liver function studies: Secondary | ICD-10-CM

## 2021-03-29 DIAGNOSIS — K580 Irritable bowel syndrome with diarrhea: Secondary | ICD-10-CM

## 2021-03-29 DIAGNOSIS — K5901 Slow transit constipation: Secondary | ICD-10-CM | POA: Diagnosis not present

## 2021-03-29 MED ORDER — RIFAXIMIN 550 MG PO TABS: 550.0000 mg | ORAL_TABLET | Freq: Three times a day (TID) | ORAL | 0 refills | Status: AC

## 2021-04-06 LAB — VITAMIN E
Vitamin E (Alpha Tocopherol): 14.9 mg/L (ref 7.0–25.1)
Vitamin E(Gamma Tocopherol): 0.5 mg/L (ref 0.5–5.5)

## 2021-04-06 LAB — ZINC: Zinc: 55 ug/dL (ref 44–115)

## 2021-04-06 LAB — VITAMIN K1, SERUM: VITAMIN K1: 0.37 ng/mL (ref 0.10–2.20)

## 2021-04-06 LAB — VITAMIN D 1,25 DIHYDROXY
Vitamin D 1, 25 (OH)2 Total: 57 pg/mL
Vitamin D2 1, 25 (OH)2: <10 pg/mL
Vitamin D3 1, 25 (OH)2: 57 pg/mL

## 2021-04-06 LAB — COPPER, SERUM: Copper: 91 ug/dL (ref 80–158)

## 2021-04-06 LAB — VITAMIN A: Vitamin A: 29.1 ug/dL (ref 20.1–62.0)

## 2021-04-07 ENCOUNTER — Other Ambulatory Visit: Payer: Self-pay

## 2021-04-07 DIAGNOSIS — R7989 Other specified abnormal findings of blood chemistry: Secondary | ICD-10-CM

## 2021-04-07 LAB — CBC WITH DIFFERENTIAL/PLATELET
Basophils Absolute: 0.1 10*3/uL (ref 0.0–0.2)
Basos: 2 %
EOS (ABSOLUTE): 0.1 10*3/uL (ref 0.0–0.4)
Eos: 2 %
Hematocrit: 34.3 % (ref 34.0–46.6)
Hemoglobin: 11.9 g/dL (ref 11.1–15.9)
Immature Grans (Abs): 0 10*3/uL (ref 0.0–0.1)
Immature Granulocytes: 1 %
Lymphocytes Absolute: 1.2 10*3/uL (ref 0.7–3.1)
Lymphs: 35 %
MCH: 30.7 pg (ref 26.6–33.0)
MCHC: 34.7 g/dL (ref 31.5–35.7)
MCV: 89 fL (ref 79–97)
Monocytes Absolute: 0.4 10*3/uL (ref 0.1–0.9)
Monocytes: 13 %
Neutrophils Absolute: 1.6 10*3/uL (ref 1.4–7.0)
Neutrophils: 47 %
Platelets: 176 10*3/uL (ref 150–450)
RBC: 3.87 x10E6/uL (ref 3.77–5.28)
RDW: 11.7 % (ref 11.7–15.4)
WBC: 3.3 10*3/uL — ABNORMAL LOW (ref 3.4–10.8)

## 2021-04-07 LAB — HEPATIC FUNCTION PANEL
ALT: 74 IU/L — ABNORMAL HIGH (ref 0–32)
AST: 62 IU/L — ABNORMAL HIGH (ref 0–40)
Albumin: 5 g/dL — ABNORMAL HIGH (ref 3.8–4.9)
Alkaline Phosphatase: 43 IU/L — ABNORMAL LOW (ref 44–121)
Bilirubin Total: 0.3 mg/dL (ref 0.0–1.2)
Bilirubin, Direct: <0.1 mg/dL (ref 0.00–0.40)
Total Protein: 6.8 g/dL (ref 6.0–8.5)

## 2021-04-07 LAB — GAMMA GT: GGT: 13 IU/L (ref 0–60)

## 2021-04-07 LAB — CK: Total CK: 181 U/L (ref 32–182)

## 2021-04-17 ENCOUNTER — Other Ambulatory Visit: Payer: Self-pay

## 2021-04-17 DIAGNOSIS — K3184 Gastroparesis: Secondary | ICD-10-CM

## 2021-04-17 MED ORDER — MOTEGRITY 2 MG PO TABS: 2.0000 mg | ORAL_TABLET | Freq: Every day | ORAL | 3 refills | Status: DC

## 2021-04-17 NOTE — Addendum Note (Signed)
Addended by: Adela Ports on: 04/17/2021 11:55 AM   Modules accepted: Orders

## 2021-04-18 ENCOUNTER — Other Ambulatory Visit: Payer: Self-pay

## 2021-04-18 LAB — CBC WITH DIFFERENTIAL/PLATELET
Basophils Absolute: 0 10*3/uL (ref 0.0–0.2)
Basos: 1 %
EOS (ABSOLUTE): 0.1 10*3/uL (ref 0.0–0.4)
Eos: 2 %
Hematocrit: 32.3 % — ABNORMAL LOW (ref 34.0–46.6)
Hemoglobin: 10.8 g/dL — ABNORMAL LOW (ref 11.1–15.9)
Immature Grans (Abs): 0 10*3/uL (ref 0.0–0.1)
Immature Granulocytes: 0 %
Lymphocytes Absolute: 1.1 10*3/uL (ref 0.7–3.1)
Lymphs: 29 %
MCH: 30.9 pg (ref 26.6–33.0)
MCHC: 33.4 g/dL (ref 31.5–35.7)
MCV: 92 fL (ref 79–97)
Monocytes Absolute: 0.4 10*3/uL (ref 0.1–0.9)
Monocytes: 11 %
Neutrophils Absolute: 2.2 10*3/uL (ref 1.4–7.0)
Neutrophils: 57 %
Platelets: 153 10*3/uL (ref 150–450)
RBC: 3.5 x10E6/uL — ABNORMAL LOW (ref 3.77–5.28)
RDW: 12.6 % (ref 11.7–15.4)
WBC: 3.8 10*3/uL (ref 3.4–10.8)

## 2021-04-18 LAB — COMPREHENSIVE METABOLIC PANEL
ALT: 46 IU/L — ABNORMAL HIGH (ref 0–32)
AST: 43 IU/L — ABNORMAL HIGH (ref 0–40)
Albumin/Globulin Ratio: 2.8 — ABNORMAL HIGH (ref 1.2–2.2)
Albumin: 4.4 g/dL (ref 3.8–4.9)
Alkaline Phosphatase: 40 IU/L — ABNORMAL LOW (ref 44–121)
BUN/Creatinine Ratio: 19 (ref 9–23)
BUN: 14 mg/dL (ref 6–24)
Bilirubin Total: 0.2 mg/dL (ref 0.0–1.2)
CO2: 21 mmol/L (ref 20–29)
Calcium: 8.7 mg/dL (ref 8.7–10.2)
Chloride: 94 mmol/L — ABNORMAL LOW (ref 96–106)
Creatinine, Ser: 0.72 mg/dL (ref 0.57–1.00)
Globulin, Total: 1.6 g/dL (ref 1.5–4.5)
Glucose: 113 mg/dL — ABNORMAL HIGH (ref 70–99)
Potassium: 4.2 mmol/L (ref 3.5–5.2)
Sodium: 130 mmol/L — ABNORMAL LOW (ref 134–144)
Total Protein: 6 g/dL (ref 6.0–8.5)
eGFR: 99 mL/min/{1.73_m2} (ref >59)

## 2021-04-18 LAB — GAMMA GT: GGT: 11 IU/L (ref 0–60)

## 2021-04-18 LAB — CREATININE, SERUM
Creatinine, Ser: 0.72 mg/dL (ref 0.57–1.00)
eGFR: 99 mL/min/{1.73_m2} (ref >59)

## 2021-04-18 LAB — HEPATIC FUNCTION PANEL
ALT: 45 IU/L — ABNORMAL HIGH (ref 0–32)
AST: 41 IU/L — ABNORMAL HIGH (ref 0–40)
Albumin: 4.5 g/dL (ref 3.8–4.9)
Alkaline Phosphatase: 39 IU/L — ABNORMAL LOW (ref 44–121)
Bilirubin Total: 0.3 mg/dL (ref 0.0–1.2)
Bilirubin, Direct: <0.1 mg/dL (ref 0.00–0.40)
Total Protein: 6.1 g/dL (ref 6.0–8.5)

## 2021-04-18 LAB — SODIUM: Sodium: 130 mmol/L — ABNORMAL LOW (ref 134–144)

## 2021-04-18 LAB — MAGNESIUM: Magnesium: 2.2 mg/dL (ref 1.6–2.3)

## 2021-04-18 LAB — CK: Total CK: 131 U/L (ref 32–182)

## 2021-04-18 LAB — POTASSIUM: Potassium: 4.2 mmol/L (ref 3.5–5.2)

## 2021-04-19 ENCOUNTER — Encounter: Payer: Self-pay | Admitting: Gastroenterology

## 2021-04-19 ENCOUNTER — Ambulatory Visit: Payer: Federal, State, Local not specified - PPO | Admitting: Gastroenterology

## 2021-04-19 ENCOUNTER — Other Ambulatory Visit: Payer: Self-pay

## 2021-04-19 VITALS — BP 119/55 | HR 61 | Temp 98.1°F | Wt 90.2 lb

## 2021-04-19 DIAGNOSIS — K3184 Gastroparesis: Secondary | ICD-10-CM

## 2021-04-19 DIAGNOSIS — R7989 Other specified abnormal findings of blood chemistry: Secondary | ICD-10-CM

## 2021-04-19 DIAGNOSIS — K5901 Slow transit constipation: Secondary | ICD-10-CM | POA: Diagnosis not present

## 2021-04-19 DIAGNOSIS — Z79899 Other long term (current) drug therapy: Secondary | ICD-10-CM

## 2021-04-19 DIAGNOSIS — K580 Irritable bowel syndrome with diarrhea: Secondary | ICD-10-CM

## 2021-04-19 NOTE — Patient Instructions (Signed)
I will call you once I have an appointment date and time of your EKG. Once I have results for the labs and EKG, I will fax it to Dr. Erik Obey. If you have any questions, please send me a message through MyChart. Have a great day!

## 2021-04-19 NOTE — Progress Notes (Signed)
[name] MD, MRCP(U.K) [address]  [address]  [place], [place] [address]  Main: [phone]  Fax: [phone]   Primary Care Physician: [name], MD  Primary Gastroenterologist:  Dr. 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 female  Summary of history :   Initially and seen on 10/13/2018 for IBS:  has gone back and forth with diarrhea for a long time and  was diagnosed with a "weak pelvic floor" , lot of bloating and cant pass the gas. Tried gasex and IB guard which has not worked. She has had a hysterectomy , 2 kids , vaginal deliveries, large tear with second baby. Sometimes passing urine is hard. No bulging noted from her vagina. 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 She was having issues with constipation. She has been undergoing pelvic floor strengthening therapy with minimal improvement. Tried linzess caused explosive diarrhea in the beginning along with motegrity - she then stopped motegrity and linzess stopped working. Long time back tried [place].    12/16/2018:colonoscopy :poor anal tone otherwise normal.  Underwent Posterior colporrhaphy, perineorrhaphy surgery on 04/27/2019 with Dr. Karis Juba 09/01/2019: Food allergy panel  , b12,folate,TSH- normal  I referred her to see a specialist and small bowel dysmotility, fecal incontinence and bacterial overgrowth Dr. Smith Robert.  She was seen and evaluated by him in September 2021.  She had a gastric emptying study small bowel study including the whole colonic transit time study gastric emptying was delayed at 10 hours and 46 minutes.  Small bowel transit time was over 9 hours and the colonic transit time was greater than 98 hours the capsule never passed in over 119 hours.   She also underwent an enteroscopy on 03/16/2020 no evidence of Barrett's esophagus moderate amount of food of bile in the gastric body biopsies were  taken in the duodenum Unusual modulator was placed and has taken 2021 with minimal effect.  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    Her Motegrity was stopped and commenced on Linzess for slow transit constipation in addition erythromycin was commenced to speed up gastric emptying.  She was commenced on azithromycin 3 times a day before meals, milk of magnesia nightly.  Subsequently she has reverted back to [employer] then tinidazole for gastric motility as well as bacterial overgrowth syndrome MCT Oil and Citrucel.  Recommended biofeedback for dyssynergy In May 2022 she followed up with [facility] urology had readjusted In July 2022 she tested positive for COVID-19.     Interval history 03/28/2021-04/19/2021    03/29/2021: Zinc, vitamin A vitamin D, vitamin e-, vitamin K, copper were normal 04/17/2021: LFTs are improving, GGT 11, CK also Lower at 131 from 181 previously.  Hemoglobin 10.8 g creatinine 0.72 potassium 4.2 sodium 130 magnesium 2.2  Responded well to a course of Xifaxan for IBS diarrhea.  She was able to find clinical trial at the [facility] where she can be enrolled in domperidone study.  This is something we had discussed at last visit to start if she she was willing.   She has come today to have her clinical trial form filled in for a physical exam.   Current Outpatient Medications  Medication Sig  Dispense Refill   Ascorbic Acid (VITAMIN C) POWD Take 1 packet by mouth at bedtime as needed.     estradiol (CLIMARA - DOSED IN MG/24 HR) 0.1 mg/24hr patch 0.1 mg once a week.     hydrOXYzine (VISTARIL) 25 MG capsule Take 1 capsule by mouth as needed.     levothyroxine (SYNTHROID) 50 MCG tablet Take 1 tablet by mouth daily.     magnesium hydroxide  (MILK OF MAGNESIA) 400 MG/5ML suspension Take 5 mLs by mouth as needed.     Prucalopride Succinate (MOTEGRITY) 2 MG TABS Take 1 tablet (2 mg total) by mouth daily. 90 tablet 3   URE-NA 15 g PACK USE TAKE 15 GRAMS BY MOUTH ONCE DAILY     No current facility-administered medications for this visit.    Allergies as of 04/19/2021   (No Known Allergies)    ROS:  General: Negative for anorexia, weight loss, fever, chills, fatigue, weakness. ENT: Negative for hoarseness, difficulty swallowing , nasal congestion. CV: Negative for chest pain, angina, palpitations, dyspnea on exertion, peripheral edema.  Respiratory: Negative for dyspnea at rest, dyspnea on exertion, cough, sputum, wheezing.  GI: See history of present illness. GU:  Negative for dysuria, hematuria, urinary incontinence, urinary frequency, nocturnal urination.  Endo: Negative for unusual weight change.    Physical Examination:   BP (!) 119/55   Pulse 61   Temp 98.1 F (36.7 C) (Oral)   Wt 90 lb 3.2 oz (40.9 kg)   LMP 07/02/2018   BMI 16.50 kg/m   General: Appears very thin and cachectic but comfortable not in any pain or distress Eyes: No icterus. Conjunctivae pink. Mouth: Oropharyngeal mucosa moist and pink , no lesions erythema or exudate. Lungs: Clear to auscultation bilaterally. Non-labored. Heart: Regular rate and rhythm, no murmurs rubs or gallops.  Abdomen: Bowel sounds are normal, nontender, nondistended, no hepatosplenomegaly or masses, no abdominal bruits or hernia , no rebound or guarding.   Extremities: No lower extremity edema. No clubbing or deformities. Neuro: Alert and oriented x 3.  Grossly intact. Skin: Warm and dry, no jaundice.   Psych: Alert and cooperative, normal mood and affect.   Imaging Studies: No results found.  Assessment and Plan:   Vanessa Gillespie is a 55 y.o. 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  Status post InterStim placement at [facility] for pelvic floor dysfunction which is being managed at [facility] presently .  Seen for second opinion by Dr. Elisha Ponder for the above problems and was placed on azithromycin 3 times a day for over 9 months.  Not receiving any benefit, in addition had elevated LFTs and hence I stopped the azithromycin in 03/21/2021.  LFTs are improving.  Responded well to a course of Xifaxan for IBS diarrhea    1.  Domperidone has been used for improving gastric motility.  There is a black box warning in relation to the same for his cardiac side effects which I have discussed.  She is going to be enrolled in a clinical trial at the [facility].  I have obtained electrolytes, CMP and will obtain an EKG. I have filled out the form requested by [facility] to enroll her in the clinical trial.  I will send a copy of recent labs as well as my office note.  2.  She will contact me if she requires any further Xifaxan for IBS-D.  Dr [name]  MD,MRCP [facility]) Follow up in as needed

## 2021-04-19 NOTE — Addendum Note (Signed)
Addended by: Adela Ports on: 04/19/2021 12:06 PM   Modules accepted: Orders

## 2021-04-20 ENCOUNTER — Ambulatory Visit
Admission: RE | Admit: 2021-04-20 | Discharge: 2021-04-20 | Disposition: A | Discharge status: Home / Self Care | Payer: Federal, State, Local not specified - PPO | Source: Ambulatory Visit | Attending: Gastroenterology | Admitting: Gastroenterology

## 2021-04-20 DIAGNOSIS — Z79899 Other long term (current) drug therapy: Secondary | ICD-10-CM | POA: Diagnosis present

## 2021-04-21 ENCOUNTER — Ambulatory Visit: Payer: Federal, State, Local not specified - PPO

## 2021-04-24 MED ORDER — RIFAXIMIN 200 MG PO TABS: 200.0000 mg | ORAL_TABLET | Freq: Three times a day (TID) | ORAL | 0 refills | Status: DC

## 2021-04-24 NOTE — Addendum Note (Signed)
Addended by: Adela Ports on: 04/24/2021 02:53 PM   Modules accepted: Orders

## 2021-04-26 ENCOUNTER — Other Ambulatory Visit: Payer: Self-pay

## 2021-04-26 MED ORDER — RIFAXIMIN 550 MG PO TABS: 550.0000 mg | ORAL_TABLET | Freq: Three times a day (TID) | ORAL | 0 refills | Status: AC

## 2021-04-26 MED ORDER — MOTEGRITY 2 MG PO TABS: 2.0000 mg | ORAL_TABLET | Freq: Every day | ORAL | 3 refills | Status: DC

## 2021-05-04 ENCOUNTER — Telehealth: Payer: Self-pay

## 2021-05-04 ENCOUNTER — Other Ambulatory Visit: Payer: Self-pay | Admitting: Gastroenterology

## 2021-05-04 NOTE — Telephone Encounter (Signed)
Patient was called but didn't answer. However, she communicates well with MyChart patient message. Therefore, I sent her a message with the following information:  Hello Vanessa Gillespie. I wanted to let you know that Dr. Tobi Bastos was able to figure something out for you. He wants you to call [facility] [place] at [phone] so you could get your medication: Domperidone 10 mg QID PRN for nausea and vomiting. Dr. Tobi Bastos stated that he would like for you to let him know when you get your medication so he could talk to you first. Please let me know if you have any questions. I will fax patient's prescription as told to [phone]. They will then get in contact with the patient and deliver her medication.

## 2021-05-12 ENCOUNTER — Other Ambulatory Visit: Payer: Self-pay

## 2021-05-12 DIAGNOSIS — R634 Abnormal weight loss: Secondary | ICD-10-CM

## 2021-05-16 LAB — CBC
Hematocrit: 36.6 % (ref 34.0–46.6)
Hemoglobin: 12.8 g/dL (ref 11.1–15.9)
MCH: 31.8 pg (ref 26.6–33.0)
MCHC: 35 g/dL (ref 31.5–35.7)
MCV: 91 fL (ref 79–97)
Platelets: 150 10*3/uL (ref 150–450)
RBC: 4.03 x10E6/uL (ref 3.77–5.28)
RDW: 11.9 % (ref 11.7–15.4)
WBC: 3.5 10*3/uL (ref 3.4–10.8)

## 2021-05-16 LAB — HEPATIC FUNCTION PANEL
ALT: 30 IU/L (ref 0–32)
AST: 33 IU/L (ref 0–40)
Albumin: 4.7 g/dL (ref 3.8–4.9)
Alkaline Phosphatase: 40 IU/L — ABNORMAL LOW (ref 44–121)
Bilirubin Total: 0.3 mg/dL (ref 0.0–1.2)
Bilirubin, Direct: <0.1 mg/dL (ref 0.00–0.40)
Total Protein: 6.8 g/dL (ref 6.0–8.5)

## 2021-05-18 ENCOUNTER — Encounter: Payer: Self-pay | Admitting: Gastroenterology

## 2021-05-22 ENCOUNTER — Other Ambulatory Visit: Payer: Self-pay

## 2021-05-22 DIAGNOSIS — Z79899 Other long term (current) drug therapy: Secondary | ICD-10-CM

## 2021-05-23 ENCOUNTER — Other Ambulatory Visit: Payer: Self-pay

## 2021-05-23 ENCOUNTER — Ambulatory Visit
Admission: RE | Admit: 2021-05-23 | Discharge: 2021-05-23 | Disposition: A | Discharge status: Home / Self Care | Payer: Federal, State, Local not specified - PPO | Source: Ambulatory Visit | Attending: Gastroenterology | Admitting: Gastroenterology

## 2021-05-23 DIAGNOSIS — Z5181 Encounter for therapeutic drug level monitoring: Secondary | ICD-10-CM | POA: Diagnosis not present

## 2021-05-23 DIAGNOSIS — Z79899 Other long term (current) drug therapy: Secondary | ICD-10-CM | POA: Diagnosis present

## 2021-06-03 DIAGNOSIS — R350 Frequency of micturition: Secondary | ICD-10-CM | POA: Insufficient documentation

## 2021-06-03 DIAGNOSIS — R3915 Urgency of urination: Secondary | ICD-10-CM | POA: Insufficient documentation

## 2021-06-03 DIAGNOSIS — N398 Other specified disorders of urinary system: Secondary | ICD-10-CM | POA: Insufficient documentation

## 2021-06-05 ENCOUNTER — Encounter: Payer: Self-pay | Admitting: Gastroenterology

## 2021-06-06 ENCOUNTER — Encounter: Payer: Self-pay | Admitting: Gastroenterology

## 2021-06-09 ENCOUNTER — Encounter: Payer: Self-pay | Admitting: Gastroenterology

## 2021-06-13 ENCOUNTER — Encounter: Payer: Self-pay | Admitting: Gastroenterology

## 2021-06-28 ENCOUNTER — Encounter: Payer: Self-pay | Admitting: Gastroenterology

## 2021-06-29 ENCOUNTER — Encounter: Payer: Self-pay | Admitting: Gastroenterology

## 2021-07-04 ENCOUNTER — Encounter: Payer: Self-pay | Admitting: Gastroenterology

## 2021-07-13 ENCOUNTER — Encounter: Payer: Self-pay | Admitting: Gastroenterology

## 2021-07-14 ENCOUNTER — Other Ambulatory Visit: Payer: Self-pay

## 2021-07-14 MED ORDER — RIFAXIMIN 550 MG PO TABS: 550.0000 mg | ORAL_TABLET | Freq: Three times a day (TID) | ORAL | 0 refills | Status: AC

## 2021-07-20 ENCOUNTER — Encounter: Payer: Self-pay | Admitting: Gastroenterology

## 2021-07-26 ENCOUNTER — Other Ambulatory Visit: Payer: Self-pay

## 2021-07-26 ENCOUNTER — Encounter: Payer: Self-pay | Admitting: Gastroenterology

## 2021-07-26 DIAGNOSIS — Z79899 Other long term (current) drug therapy: Secondary | ICD-10-CM

## 2021-07-27 DIAGNOSIS — Z960 Presence of urogenital implants: Secondary | ICD-10-CM | POA: Insufficient documentation

## 2021-08-04 ENCOUNTER — Encounter: Payer: Self-pay | Admitting: Gastroenterology

## 2021-08-07 ENCOUNTER — Encounter: Payer: Self-pay | Admitting: Gastroenterology

## 2021-08-09 ENCOUNTER — Encounter: Payer: Self-pay | Admitting: Gastroenterology

## 2021-08-10 ENCOUNTER — Other Ambulatory Visit: Payer: Self-pay

## 2021-08-10 DIAGNOSIS — M79643 Pain in unspecified hand: Secondary | ICD-10-CM | POA: Insufficient documentation

## 2021-08-14 ENCOUNTER — Ambulatory Visit: Payer: Federal, State, Local not specified - PPO

## 2021-08-18 ENCOUNTER — Other Ambulatory Visit: Payer: Self-pay

## 2021-08-18 ENCOUNTER — Ambulatory Visit
Admission: RE | Admit: 2021-08-18 | Discharge: 2021-08-18 | Disposition: A | Discharge status: Home / Self Care | Payer: Federal, State, Local not specified - PPO | Source: Ambulatory Visit | Attending: Gastroenterology | Admitting: Gastroenterology

## 2021-08-18 DIAGNOSIS — Z79899 Other long term (current) drug therapy: Secondary | ICD-10-CM | POA: Insufficient documentation

## 2021-08-19 NOTE — Progress Notes (Signed)
EKG looks stable

## 2021-08-21 ENCOUNTER — Encounter: Payer: Self-pay | Admitting: Gastroenterology

## 2021-08-21 ENCOUNTER — Ambulatory Visit: Payer: Federal, State, Local not specified - PPO

## 2021-08-26 ENCOUNTER — Encounter: Payer: Self-pay | Admitting: Gastroenterology

## 2021-08-28 ENCOUNTER — Telehealth: Payer: Self-pay | Admitting: Gastroenterology

## 2021-08-28 NOTE — Telephone Encounter (Signed)
Patient states she needs a refill on her medication and is requesting a call back.

## 2021-08-29 NOTE — Telephone Encounter (Signed)
Patient was contacted and informed her that we are waiting on Dr. Tobi Bastos to write her the letter to customs in order for her to get her medications. Please read patient message from 08/29/2021.

## 2021-09-06 ENCOUNTER — Other Ambulatory Visit: Payer: Self-pay

## 2021-09-06 ENCOUNTER — Encounter: Payer: Self-pay | Admitting: Gastroenterology

## 2021-09-07 ENCOUNTER — Encounter: Payer: Self-pay | Admitting: Gastroenterology

## 2021-09-07 ENCOUNTER — Other Ambulatory Visit: Payer: Self-pay

## 2021-09-07 ENCOUNTER — Ambulatory Visit (INDEPENDENT_AMBULATORY_CARE_PROVIDER_SITE_OTHER): Payer: Federal, State, Local not specified - PPO | Admitting: Gastroenterology

## 2021-09-07 ENCOUNTER — Ambulatory Visit: Payer: Federal, State, Local not specified - PPO | Admitting: Gastroenterology

## 2021-09-07 VITALS — BP 113/81 | HR 62 | Temp 98.7°F | Wt 88.0 lb

## 2021-09-07 DIAGNOSIS — Z79899 Other long term (current) drug therapy: Secondary | ICD-10-CM | POA: Diagnosis not present

## 2021-09-07 DIAGNOSIS — R140 Abdominal distension (gaseous): Secondary | ICD-10-CM | POA: Diagnosis not present

## 2021-09-07 DIAGNOSIS — R5383 Other fatigue: Secondary | ICD-10-CM

## 2021-09-07 DIAGNOSIS — M6289 Other specified disorders of muscle: Secondary | ICD-10-CM

## 2021-09-07 DIAGNOSIS — K9289 Other specified diseases of the digestive system: Secondary | ICD-10-CM

## 2021-09-07 NOTE — Addendum Note (Signed)
Addended by: Adela Ports on: 09/07/2021 04:53 PM ? ? Modules accepted: Orders ? ?

## 2021-09-07 NOTE — Progress Notes (Signed)
Vanessa Mood MD, MRCP(U.K) [address]  [address]  [place], [place] [address]  Main: [phone]  Fax: [phone]   Primary Care Physician: [name], MD  Primary Gastroenterologist:  Dr. Wyline Gillespie   Chief Complaint  Patient presents with   Gastroparesis    HPI: Vanessa Gillespie is a 56 y.o. female  Summary of history :   Initially and seen on 10/13/2018 for IBS:  has gone back and forth with diarrhea for a long time and  was diagnosed with a "weak pelvic floor" , lot of bloating and cant pass the gas. Tried gasex and IB guard which has not worked. She has had a hysterectomy , 2 kids , vaginal deliveries, large tear with second baby. Sometimes passing urine is hard. No bulging noted from her vagina. 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 She was having issues with constipation. She has been undergoing pelvic floor strengthening therapy with minimal improvement. Tried linzess caused explosive diarrhea in the beginning along with motegrity - she then stopped motegrity and linzess stopped working. Long time back tried [place].    12/16/2018:colonoscopy :poor anal tone otherwise normal.  Underwent Posterior colporrhaphy, perineorrhaphy surgery on 04/27/2019 with Dr. Karis Juba 09/01/2019: Food allergy panel  , b12,folate,TSH- normal  I referred her to see a specialist and small bowel dysmotility, fecal incontinence and bacterial overgrowth Dr. Smith Robert.  She was seen and evaluated by him in September 2021.  She had a gastric emptying study small bowel study including the whole colonic transit time study gastric emptying was delayed at 10 hours and 46 minutes.  Small bowel transit time was over 9 hours and the colonic transit time was greater than 98 hours the capsule never passed in over 119 hours.   She also underwent an enteroscopy on 03/16/2020 no evidence of Barrett's esophagus moderate amount of food of bile in the gastric body biopsies were taken in the  duodenum Unusual modulator was placed and has taken 2021 with minimal effect.  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    Her Motegrity was stopped and commenced on Linzess for slow transit constipation in addition erythromycin was commenced to speed up gastric emptying.  She was commenced on azithromycin 3 times a day before meals, milk of magnesia nightly.  Subsequently she has reverted back to [employer] then tinidazole for gastric motility as well as bacterial overgrowth syndrome MCT Oil and Citrucel.  Recommended biofeedback for dyssynergy In May 2022 she followed up with [facility] urology had readjusted In July 2022 she tested positive for COVID-19.    03/29/2021: Zinc, vitamin A vitamin D, vitamin e-, vitamin K, copper were normal 04/17/2021: LFTs are improving, GGT 11, CK also Lower at 131 from 181 previously.  Hemoglobin 10.8 g creatinine 0.72 potassium 4.2 sodium 130 magnesium 2.2    Interval history 04/19/2021-09/07/2021   She states that as long as she takes the domperidone can keep the food down.  She states that her weight has been stable.  The pelvic floor stimulator has not worked.  On and off bloating.  Feels she might need a course of Xifaxan Down the road.   Current Outpatient Medications  Medication Sig Dispense Refill   Aloe Vera 25 MG CAPS Take 1 capsule by mouth 1 day or  1 dose.     baclofen (LIORESAL) 10 MG tablet Take 10 mg by mouth 3 (three) times daily.     dexlansoprazole (DEXILANT) 60 MG capsule Dexilant 60 mg capsule, delayed release     diazepam (VALIUM) 10 MG tablet Insert one daily into the vagina as needed for pain. NOT for ORAL USE     doxepin (SINEQUAN) 10 MG capsule doxepin 10 mg capsule     DULoxetine (CYMBALTA) 20 MG capsule  Cymbalta 20 mg capsule,delayed release  Take 1 capsule at hs by oral route.     estradiol (CLIMARA - DOSED IN MG/24 HR) 0.1 mg/24hr patch 0.1 mg once a week.     estradiol (VIVELLE-DOT) 0.075 MG/24HR Vivelle-Dot 0.075 mg/24 hr transdermal patch     fluticasone (CUTIVATE) 0.005 % ointment fluticasone propionate 0.005 % topical ointment  APPLY TOPICALLY TWICE DAILY     folic acid (FOLVITE) 1 MG tablet folic acid 1 mg tablet     frovatriptan (FROVA) 2.5 MG tablet Frova 2.5 mg tablet     gentamicin ointment (GARAMYCIN) 0.1 % gentamicin 0.1 % topical ointment     hydroxychloroquine (PLAQUENIL) 200 MG tablet hydroxychloroquine 200 mg tablet     hydrOXYzine (VISTARIL) 25 MG capsule Take 1 capsule by mouth as needed.     levothyroxine (SYNTHROID) 50 MCG tablet Take by mouth.     magnesium hydroxide (MILK OF MAGNESIA) 400 MG/5ML suspension Take 5 mLs by mouth as needed.     meloxicam (MOBIC) 15 MG tablet meloxicam 15 mg tablet     methotrexate (RHEUMATREX) 2.5 MG tablet methotrexate sodium 2.5 mg tablet     mometasone (NASONEX) 50 MCG/ACT nasal spray Nasonex 50 mcg/actuation Spray     naproxen (NAPROSYN) 500 MG tablet naproxen 500 mg tablet     nortriptyline (PAMELOR) 10 MG capsule nortriptyline 10 mg capsule     Nutritional Supplements (BOOST VERY HIGH CALORIE) LIQD Take by mouth.     oxyCODONE (OXY IR/ROXICODONE) 5 MG immediate release tablet oxycodone 5 mg tablet  TAKE 1 TABLET BY MOUTH EVERY 6 HOURS AS NEEDED FOR BREAKTHROUGH PAIN     Pediatric Multivitamins-Iron (FLINTSTONES COMPLETE) 10 MG CHEW Chew by mouth.     Prucalopride Succinate (MOTEGRITY) 2 MG TABS Take 1 tablet (2 mg total) by mouth daily. 90 tablet 3   sucralfate (CARAFATE) 1 g tablet sucralfate 1 gram tablet     topiramate (TOPAMAX) 25 MG tablet topiramate 25 mg tablet     traMADol (ULTRAM) 50 MG tablet tramadol 50 mg tablet     urea (URE-NA) 15 g PACK oral packet daily as needed.     XIFAXAN 200 MG tablet Take 200 mg by mouth 3  (three) times daily.     No current facility-administered medications for this visit.    Allergies as of 09/07/2021   (No Known Allergies)    ROS:  General: Negative for anorexia, weight loss, fever, chills, fatigue, weakness. ENT: Negative for hoarseness, difficulty swallowing , nasal congestion. CV: Negative for chest pain, angina, palpitations, dyspnea on exertion, peripheral edema.  Respiratory: Negative for dyspnea at rest, dyspnea on exertion, cough, sputum, wheezing.  GI: See history of present illness. GU:  Negative for dysuria, hematuria, urinary incontinence, urinary frequency, nocturnal urination.  Endo: Negative for unusual weight change.    Physical Examination:   BP 113/81    Pulse 62    Temp 98.7 F (37.1 C) (Oral)    Wt 88 lb (39.9 kg)    LMP 07/02/2018  BMI 16.10 kg/m   General: Well-nourished, well-developed in no acute distress.  Eyes: No icterus. Conjunctivae pink. Neuro: Alert and oriented x 3.  Grossly intact. Skin: Warm and dry, no jaundice.   Psych: Alert and cooperative, normal Gillespie and affect.   Imaging Studies: No results found.  Assessment and Plan:   Vanessa Gillespie is a 56 y.o. 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  Status post InterStim placement at [facility] for pelvic floor dysfunction which is being managed at [facility] presently .  Seen for second opinion by Dr. Vic Ripper for the above problems and was placed on azithromycin 3 times a day for over 9 months.  Not receiving any benefit, in addition had elevated LFTs and hence I stopped the azithromycin in 03/21/2021.    Responded well to a course of Xifaxan for IBS diarrhea.  We did prescribe her domperidone which she obtains from [place] which has worked very well which has made her not lose any weight and improve her quality of life.  We are having difficulty getting it from  [place] and at our hospital we do not have the ability to get an IND from FDA with all the IRB permits as we do not have a department in her hospital for the same.  I recommended that we refer her to [facility] who may be able to facilitate this better as they have an [place].     She probably has either medium production from chronic constipation versus bacterial overgrowth syndrome which responds to Xifaxan and I told her in the future if we need to give her a course probably better to hold domperidone during the process to prevent any drug interaction.  She is due for an EKG every 6 weekly which will be in April 1 week.  I will check her iron levels, B12, folate, vitamin D, TSH as she has some fatigue along with her CBC.  I discussed with her about autoimmune GI tract dysmotility and to contact [facility] to see if they could evaluate for the same or enrolled in the clinical trial.  Treatment of which if positive would be immunotherapy.  I advised her to keep me posted about the progress. I will send a letter to her [place] once we hear back from them about her appointment about reason for referral.  Dr Jonathon Bellows  MD,MRCP [facility]) Follow up in as needed

## 2021-09-07 NOTE — Patient Instructions (Addendum)
We will send a referral to [facility] and they will review your chart and then call you to schedule an appointment. ? ?Your EKG was scheduled for 09/29/2021 at 2:30 PM at the [facility]. ? ?Dr. Tobi Bastos ordered labs for you. ? ?We will MISS YOU!!!! But you could always send [place] messages ;-) ? ?In behalf of Dr. Tobi Bastos and I, we want to thank you for our gifts! We truly appreciate your drawings! Keep up the good work! ? ? ?

## 2021-09-10 ENCOUNTER — Encounter: Payer: Self-pay | Admitting: Gastroenterology

## 2021-09-13 LAB — CBC WITH DIFFERENTIAL/PLATELET
Basophils Absolute: 0 10*3/uL (ref 0.0–0.2)
Basos: 1 %
EOS (ABSOLUTE): 0.1 10*3/uL (ref 0.0–0.4)
Eos: 2 %
Hematocrit: 39.6 % (ref 34.0–46.6)
Hemoglobin: 13.4 g/dL (ref 11.1–15.9)
Immature Grans (Abs): 0 10*3/uL (ref 0.0–0.1)
Immature Granulocytes: 1 %
Lymphocytes Absolute: 1.1 10*3/uL (ref 0.7–3.1)
Lymphs: 30 %
MCH: 31.6 pg (ref 26.6–33.0)
MCHC: 33.8 g/dL (ref 31.5–35.7)
MCV: 93 fL (ref 79–97)
Monocytes Absolute: 0.4 10*3/uL (ref 0.1–0.9)
Monocytes: 10 %
Neutrophils Absolute: 2 10*3/uL (ref 1.4–7.0)
Neutrophils: 56 %
Platelets: 182 10*3/uL (ref 150–450)
RBC: 4.24 x10E6/uL (ref 3.77–5.28)
RDW: 12.6 % (ref 11.7–15.4)
WBC: 3.6 10*3/uL (ref 3.4–10.8)

## 2021-09-13 LAB — VITAMIN C: Vitamin C: 1.3 mg/dL (ref 0.4–2.0)

## 2021-09-13 LAB — VITAMIN D 25 HYDROXY (VIT D DEFICIENCY, FRACTURES): Vit D, 25-Hydroxy: 30.8 ng/mL (ref 30.0–100.0)

## 2021-09-13 LAB — IRON,TIBC AND FERRITIN PANEL
Ferritin: 51 ng/mL (ref 15–150)
Iron Saturation: 39 % (ref 15–55)
Iron: 107 ug/dL (ref 27–159)
Total Iron Binding Capacity: 275 ug/dL (ref 250–450)
UIBC: 168 ug/dL (ref 131–425)

## 2021-09-13 LAB — B12 AND FOLATE PANEL
Folate: >20 ng/mL (ref >3.0)
Vitamin B-12: 698 pg/mL (ref 232–1245)

## 2021-09-13 LAB — TSH: TSH: 0.903 u[IU]/mL (ref 0.450–4.500)

## 2021-09-18 NOTE — Telephone Encounter (Signed)
Good morning ? ?Sorry I just got back from vacation ? ?Reviewed your email ?1.Maritza please send a referral to Dr. Alycia Rossetti to be evaluated for gastroparesis ?2.  If you have the contact of Dr. Margarette Asal I will be happy to contact him. ?3.  Low vitamin D replace please send prescription ?4.  I will still be your  local GI doctor if you require anything please do not hesitate to contact me ?5.  Do you  think you require rifaximin at this time ?

## 2021-09-19 ENCOUNTER — Other Ambulatory Visit: Payer: Self-pay

## 2021-09-19 MED ORDER — VITAMIN D3 1.25 MG (50000 UT) PO CAPS
1.0000 | ORAL_CAPSULE | ORAL | 1 refills | Status: AC
Start: 2021-09-19 — End: 2021-11-18

## 2021-09-26 ENCOUNTER — Encounter: Payer: Self-pay | Admitting: Gastroenterology

## 2021-09-26 ENCOUNTER — Other Ambulatory Visit: Payer: Self-pay

## 2021-09-26 MED ORDER — XIFAXAN 200 MG PO TABS: 200.0000 mg | ORAL_TABLET | Freq: Three times a day (TID) | ORAL | 0 refills | Status: DC

## 2021-09-28 ENCOUNTER — Other Ambulatory Visit: Payer: Self-pay

## 2021-09-28 MED ORDER — RIFAXIMIN 550 MG PO TABS
550.0000 mg | ORAL_TABLET | Freq: Two times a day (BID) | ORAL | 0 refills | Status: DC
Start: 2021-09-28 — End: 2021-09-28

## 2021-09-28 MED ORDER — RIFAXIMIN 550 MG PO TABS: 550.0000 mg | ORAL_TABLET | Freq: Three times a day (TID) | ORAL | 0 refills | Status: AC

## 2021-09-28 NOTE — Addendum Note (Signed)
Addended by: Adela Ports on: 09/28/2021 11:45 AM ? ? Modules accepted: Orders ? ?

## 2021-09-29 ENCOUNTER — Ambulatory Visit: Payer: Federal, State, Local not specified - PPO

## 2021-10-03 ENCOUNTER — Encounter: Payer: Self-pay | Admitting: Gastroenterology

## 2021-10-05 ENCOUNTER — Encounter: Payer: Self-pay | Admitting: Gastroenterology

## 2021-10-09 ENCOUNTER — Encounter: Payer: Self-pay | Admitting: Gastroenterology

## 2021-10-10 NOTE — Telephone Encounter (Signed)
Yes its ok proceed

## 2021-10-10 NOTE — Telephone Encounter (Signed)
Please send referral

## 2021-10-11 ENCOUNTER — Ambulatory Visit: Payer: Federal, State, Local not specified - PPO

## 2021-10-11 ENCOUNTER — Other Ambulatory Visit: Payer: Self-pay

## 2021-10-11 DIAGNOSIS — K3184 Gastroparesis: Secondary | ICD-10-CM

## 2021-10-11 NOTE — Progress Notes (Signed)
Referral: Gastroparesis ?[facility] ?A facility of [facility] ?[address], [address] ?[place], [place] [address] ?Phone: [phone] ?Fax # [phone] ? ?Contacted April at [facility].  She recommended placing the order in Epic, and have patient contact her to have a new patient chart created.  I will send patient a my chart message to let her know to contact the office to arrange this.  I will also place the external referral in Epic, and send paper referral over to ensure the referral is received. ? ?Thanks, ?Marcelino Duster, CMA ? ? ?

## 2021-10-12 ENCOUNTER — Other Ambulatory Visit: Payer: Self-pay

## 2021-10-12 ENCOUNTER — Encounter: Payer: Self-pay | Admitting: Gastroenterology

## 2021-10-12 ENCOUNTER — Ambulatory Visit: Payer: Federal, State, Local not specified - PPO | Admitting: Gastroenterology

## 2021-10-12 VITALS — BP 113/76 | HR 62 | Temp 99.1°F | Ht 63.0 in | Wt 91.4 lb

## 2021-10-12 DIAGNOSIS — Z79899 Other long term (current) drug therapy: Secondary | ICD-10-CM | POA: Diagnosis not present

## 2021-10-12 DIAGNOSIS — K3184 Gastroparesis: Secondary | ICD-10-CM | POA: Diagnosis not present

## 2021-10-12 NOTE — Progress Notes (Signed)
?  ?[name] MD, MRCP(U.K) ?[address]  ?[address]  ?[place], [place] [address]  ?Main: [phone]  ?Fax: [phone] ? ? ?Primary Care Physician: [name], MD ? ?Primary Gastroenterologist:  Dr. [name]  ? ?Chief Complaint  ?Patient presents with  ? Follow-up  ? ? ?HPI: Vanessa Gillespie is a 56 y.o. female ? ?Summary of history : ?  ?Initially and seen on 10/13/2018 for IBS:  has gone back and forth with diarrhea for a long time and  was diagnosed with a "weak pelvic floor" , lot of bloating and cant pass the gas. Tried gasex and IB guard which has not worked. She has had a hysterectomy , 2 kids , vaginal deliveries, large tear with second baby. Sometimes passing urine is hard. No bulging noted from her vagina. 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 She was having issues with constipation. She has been undergoing pelvic floor strengthening therapy with minimal improvement. ?Tried linzess caused explosive diarrhea in the beginning along with motegrity - she then stopped motegrity and linzess stopped working. Long time back tried [place].  ?  ?12/16/2018:colonoscopy :poor anal tone otherwise normal.  ?Underwent Posterior colporrhaphy, perineorrhaphy surgery on 04/27/2019 with Dr. Karis Juba ?09/01/2019: Food allergy panel  , b12,folate,TSH- normal . ? ? ?I referred her to see a specialist and small bowel dysmotility, fecal incontinence and bacterial overgrowth Dr. Smith Robert.  She was seen and evaluated by him in September 2021.  She had a gastric emptying study small bowel study including the whole colonic transit time study gastric emptying was delayed at 10 hours and 46 minutes.  Small bowel transit time was over 9 hours and the colonic transit time was greater than 98 hours the capsule never passed in over 119 hours. ?  ?She also underwent an enteroscopy on 03/16/2020 no evidence of Barrett's esophagus moderate amount of food of bile in the gastric body biopsies were taken in the  duodenum ?Unusual modulator was placed and has taken 2021 with minimal effect.  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   ? ?Her Motegrity was stopped and commenced on Linzess for slow transit constipation in addition erythromycin was commenced to speed up gastric emptying.  She was commenced on azithromycin 3 times a day before meals, milk of magnesia nightly.  Subsequently she has reverted back to [employer] then tinidazole for gastric motility as well as bacterial overgrowth syndrome MCT Oil and Citrucel.  Recommended biofeedback for dyssynergy ?In May 2022 she followed up with [facility] urology had readjusted ?In July 2022 she tested positive for COVID-19.  ?  ?03/29/2021: Zinc, vitamin A vitamin D, vitamin e-, vitamin K, copper were normal ?04/17/2021: LFTs are improving, GGT 11, CK also Lower at 131 from 181 previously.  Hemoglobin 10.8 g creatinine 0.72 potassium 4.2 sodium 130 magnesium 2.2 ?  ?  ?Interval history 09/07/2021-10/12/2021 ? ? ?She is presently taking domperidone 3 times a day and with that she is having a reduction in her symptoms but continues to have inability and consuming larger meals.  She is not losing weight.  ? She is keeping a close watch on her weight.  She has a virtual appointment with a physician in [facility] who specializes in gastroparesis.  She was referred to [facility] but they declined  to see her ? ? ?Current Outpatient Medications  ?Medication Sig Dispense Refill  ? Aloe Vera 25 MG CAPS Take 1 capsule by mouth 1 day or 1 dose.    ? diazepam (VALIUM) 10 MG tablet Insert one daily into the vagina as needed for pain. NOT for ORAL USE    ? estradiol (CLIMARA - DOSED IN MG/24 HR) 0.1 mg/24hr patch 0.1 mg once a week.    ? estradiol (VIVELLE-DOT) 0.075  MG/24HR Vivelle-Dot 0.075 mg/24 hr transdermal patch    ? hydroxychloroquine (PLAQUENIL) 200 MG tablet hydroxychloroquine 200 mg tablet    ? hydrOXYzine (VISTARIL) 25 MG capsule Take 1 capsule by mouth as needed.    ? levothyroxine (SYNTHROID) 50 MCG tablet Take by mouth.    ? magnesium hydroxide (MILK OF MAGNESIA) 400 MG/5ML suspension Take 5 mLs by mouth as needed.    ? Pediatric Multivitamins-Iron (FLINTSTONES COMPLETE) 10 MG CHEW Chew by mouth.    ? PRESCRIPTION MEDICATION Take 1 tablet by mouth daily. domperidone 10 MG    ? Prucalopride Succinate (MOTEGRITY) 2 MG TABS Take 1 tablet (2 mg total) by mouth daily. 90 tablet 3  ? rifaximin (XIFAXAN) 550 MG TABS tablet Take 1 tablet (550 mg total) by mouth 3 (three) times daily for 14 days. 42 tablet 0  ? urea (URE-NA) 15 g PACK oral packet daily as needed.    ? baclofen (LIORESAL) 10 MG tablet Take 10 mg by mouth 3 (three) times daily. (Patient not taking: Reported on 10/12/2021)    ? Cholecalciferol (VITAMIN D3) 1.25 MG (50000 UT) CAPS Take 1 capsule by mouth once a week. For 8 weeks. (Patient not taking: Reported on 10/12/2021) 30 capsule 1  ? gentamicin ointment (GARAMYCIN) 0.1 % gentamicin 0.1 % topical ointment (Patient not taking: Reported on 10/12/2021)    ? meloxicam (MOBIC) 15 MG tablet meloxicam 15 mg tablet (Patient not taking: Reported on 10/12/2021)    ? naproxen (NAPROSYN) 500 MG tablet naproxen 500 mg tablet (Patient not taking: Reported on 10/12/2021)    ? Nutritional Supplements (BOOST VERY HIGH CALORIE) LIQD Take by mouth. (Patient not taking: Reported on 10/12/2021)    ? oxyCODONE (OXY IR/ROXICODONE) 5 MG immediate release tablet oxycodone 5 mg tablet ? TAKE 1 TABLET BY MOUTH EVERY 6 HOURS AS NEEDED FOR BREAKTHROUGH PAIN (Patient not taking: Reported on 10/12/2021)    ? ?No current facility-administered medications for this visit.  ? ? ?Allergies as of 10/12/2021  ? (No Known Allergies)  ? ? ?ROS: ? ?General: Negative for anorexia, weight loss, fever,  chills, fatigue, weakness. ?ENT: Negative for hoarseness, difficulty swallowing , nasal congestion. ?CV: Negative for chest pain, angina, palpitations, dyspnea on exertion, peripheral edema.  ?Respiratory: Negative for dyspnea at rest, dyspnea on exertion, cough, sputum, wheezing.  ?GI: See history of present illness. ?GU:  Negative for dysuria, hematuria, urinary incontinence, urinary frequency, nocturnal urination.  ?Endo: Negative for unusual weight change.  ?  ?Physical Examination: ? ? BP 113/76   Pulse 62   Temp 99.1 ?F (37.3 ?C) (Oral)   Ht 5\' 3"  (1.6 m)   Wt 91 lb 6.4 oz (41.5 kg)   LMP 07/02/2018   BMI 16.19 kg/m?  ? ?General: Well-nourished, well-developed in no acute distress.  ?Eyes: No icterus. Conjunctivae pink. ?Mouth: Oropharyngeal mucosa moist and pink , no lesions erythema or exudate. ?Neuro: Alert and oriented x 3.  Grossly intact. ?Skin: Warm and dry, no jaundice.   ?Psych: Alert and cooperative, normal mood and  affect. ? ? ?Imaging Studies: ?No results found. ? ?Assessment and Plan:  ? ?Vanessa Gillespie is a 56 y.o. 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  Status post InterStim placement at [facility] for pelvic floor dysfunction which is being managed at [facility] presently .  Seen for second opinion by Dr. Elisha Ponder for the above problems and was placed on azithromycin 3 times a day for over 9 months.  Not receiving any benefit, in addition had elevated LFTs and hence I stopped the azithromycin in 03/21/2021.    Responded well to a course of Xifaxan for IBS diarrhea.  We did prescribe her domperidone which she obtains from [place] which has worked very well which has made her not lose any weight and improve her quality of life.  We are having difficulty getting it from [place] and at our hospital we do not have the ability to get an IND from FDA with all the IRB permits as we  do not have a department in her hospital for the same.  We referred her to [facility] but referral was declined.  She has had multiple courses of Xifaxan for bacterial overgrowth syndrome. ?  She is due for an EKG

## 2021-10-13 ENCOUNTER — Encounter: Payer: Self-pay | Admitting: Gastroenterology

## 2021-10-16 NOTE — Telephone Encounter (Signed)
1. I think Endoflip is a good idea to check the pyloric sphincter and it is an easy procedure. ? ?2.  True the best way to feed is through the GI tract.  It may be an option for a feeding J-tube.  If it were to be done who recommended to be done at a higher center tertiary where they also have access to a dietitian who can manage this well as an outpatient.  If that fails obviously TPN is the last option.  Let me know how things go

## 2021-10-17 ENCOUNTER — Ambulatory Visit: Payer: Federal, State, Local not specified - PPO

## 2021-10-19 ENCOUNTER — Encounter: Payer: Self-pay | Admitting: Gastroenterology

## 2021-10-24 ENCOUNTER — Telehealth: Payer: Self-pay

## 2021-10-24 NOTE — Telephone Encounter (Signed)
Received a voice message from [place] at [facility] in regard to GI referral.  They needed to know specific physician patient wanted to see in the office.  Phone call was made to patient to inquire.  She stated that she wanted to see Dr. Geoffry Paradise.  Fax page sent back to [facility] providing them diagnosis and physicians name. ? ?Thanks, ? ?Sharyn Lull, CMA ?

## 2021-11-08 ENCOUNTER — Other Ambulatory Visit: Payer: Self-pay

## 2021-12-13 ENCOUNTER — Encounter: Payer: Self-pay | Admitting: Gastroenterology

## 2021-12-14 ENCOUNTER — Ambulatory Visit: Payer: Federal, State, Local not specified - PPO | Admitting: Gastroenterology

## 2021-12-14 ENCOUNTER — Encounter: Payer: Self-pay | Admitting: Gastroenterology

## 2021-12-14 ENCOUNTER — Other Ambulatory Visit: Payer: Self-pay

## 2021-12-14 VITALS — BP 131/78 | HR 78

## 2021-12-14 DIAGNOSIS — R634 Abnormal weight loss: Secondary | ICD-10-CM

## 2021-12-14 DIAGNOSIS — K3184 Gastroparesis: Secondary | ICD-10-CM

## 2021-12-14 NOTE — Addendum Note (Signed)
Addended by: Adela Ports on: 12/14/2021 01:14 PM   Modules accepted: Orders

## 2021-12-14 NOTE — Progress Notes (Signed)
[name] MD, MRCP(U.K) [address]  [address]  [place], [place] [address]  Main: [phone]  Fax: [phone]   Primary Care Physician: [name], MD  Primary Gastroenterologist:  Dr. [name]   Chief Complaint  Patient presents with   Gastroparesis    HPI: KIMBERLE STANFILL is a 56 y.o. female  Summary of history :   Initially and seen on 10/13/2018 for IBS:  has gone back and forth with diarrhea for a long time and  was diagnosed with a "weak pelvic floor" , lot of bloating and cant pass the gas. Tried gasex and IB guard which has not worked. She has had a hysterectomy , 2 kids , vaginal deliveries, large tear with second baby. Sometimes passing urine is hard. No bulging noted from her vagina. 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 She was having issues with constipation. She has been undergoing pelvic floor strengthening therapy with minimal improvement. Tried linzess caused explosive diarrhea in the beginning along with motegrity - she then stopped motegrity and linzess stopped working. Long time back tried [place].    12/16/2018:colonoscopy :poor anal tone otherwise normal.  Underwent Posterior colporrhaphy, perineorrhaphy surgery on 04/27/2019 with Dr. Karis Juba 09/01/2019: Food allergy panel  , b12,folate,TSH- normal .     I referred her to see a specialist and small bowel dysmotility, fecal incontinence and bacterial overgrowth Dr. Smith Robert.  She was seen and evaluated by him in September 2021.  She had a gastric emptying study small bowel study including the whole colonic transit time study gastric emptying was delayed at 10 hours and 46 minutes.  Small bowel transit time was over 9 hours and the colonic transit time was greater than 98 hours the capsule never passed in over 119 hours.   She also underwent an enteroscopy on 03/16/2020 no evidence of Barrett's esophagus moderate amount of food of bile in the gastric body biopsies were taken  in the duodenum Unusual modulator was placed and has taken 2021 with minimal effect.  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     Her Motegrity was stopped and commenced on Linzess for slow transit constipation in addition erythromycin was commenced to speed up gastric emptying.  She was commenced on azithromycin 3 times a day before meals, milk of magnesia nightly.  Subsequently she has reverted back to [employer] then tinidazole for gastric motility as well as bacterial overgrowth syndrome MCT Oil and Citrucel.  Recommended biofeedback for dyssynergy In May 2022 she followed up with [facility] urology had readjusted In July 2022 she tested positive for COVID-19.    03/29/2021: Zinc, vitamin A vitamin D, vitamin e-, vitamin K, copper were normal 04/17/2021: LFTs are improving, GGT 11, CK also Lower at 131 from 181 previously.  Hemoglobin 10.8 g creatinine 0.72 potassium 4.2 sodium 130 magnesium 2.2     Interval history 10/12/2021-12/14/2021   At her last visit she was taking domperidone her weight has stabilized she had a new appointment with a physician in [facility] who specializes in gastroparesis.  Subsequently had an upper endoscopy and Endoflip procedure which apparently was normal.  She was previously referred to [facility] but they declined to see her.  She had her domperidone stopped and she was started on a new medication Mestinon  which has helped to some extent.  After her endoscopy she developed hyponatremia after being commenced on Bactrim and Xifaxan has lost weight and she was in tears at my office.  She is not sure where to go from here.  Unclear of the future plan.     Current Outpatient Medications  Medication Sig Dispense Refill   Aloe Vera 25 MG  CAPS Take 1 capsule by mouth 1 day or 1 dose.     amitriptyline (ELAVIL) 10 MG tablet Take 1 tablet by mouth at bedtime.     azithromycin (ZITHROMAX) 1 g powder Take 5 packets by mouth 3 (three) times daily.     baclofen (LIORESAL) 10 MG tablet Take 10 mg by mouth 3 (three) times daily.     diazepam (VALIUM) 10 MG tablet Insert one daily into the vagina as needed for pain. NOT for ORAL USE     estradiol (VIVELLE-DOT) 0.1 MG/24HR patch Place 0.1 mg onto the skin.     gentamicin ointment (GARAMYCIN) 0.1 %      hydroxychloroquine (PLAQUENIL) 200 MG tablet hydroxychloroquine 200 mg tablet     hydrOXYzine (ATARAX) 10 MG tablet Take 1 tablet by mouth daily.     hydrOXYzine (VISTARIL) 25 MG capsule Take 1 capsule by mouth as needed.     levothyroxine (SYNTHROID) 50 MCG tablet Take by mouth.     magnesium hydroxide (MILK OF MAGNESIA) 400 MG/5ML suspension Take 5 mLs by mouth as needed.     meloxicam (MOBIC) 15 MG tablet      methylPREDNISolone (MEDROL DOSEPAK) 4 MG TBPK tablet Take 1 Package by mouth daily.     naproxen (NAPROSYN) 500 MG tablet      Nirmatrelvir-Ritonavir (PAXLOVID, 300/100, PO) Take 100 mg by mouth daily.     Nutritional Supplements (BOOST VERY HIGH CALORIE) LIQD Take by mouth.     oxyCODONE (OXY IR/ROXICODONE) 5 MG immediate release tablet      Pediatric Multivitamins-Iron (FLINTSTONES COMPLETE) 10 MG CHEW Chew by mouth.     PRESCRIPTION MEDICATION Take 1 tablet by mouth daily. domperidone 10 MG     Prucalopride Succinate (MOTEGRITY) 2 MG TABS Take 1 tablet (2 mg total) by mouth daily. 90 tablet 3   pyridostigmine (MESTINON) 60 MG tablet Take 1 tablet by mouth in the morning, at noon, and at bedtime.     rifaximin (XIFAXAN) 550 MG TABS tablet Take 1 tablet by mouth in the morning and at bedtime.     urea (URE-NA) 15 g PACK oral packet Take 1 packet by mouth at bedtime.     No current facility-administered medications for this visit.    Allergies as of 12/14/2021   (No Known  Allergies)    ROS:  General: Negative for anorexia, weight loss, fever, chills, fatigue, weakness. ENT: Negative for hoarseness, difficulty swallowing , nasal congestion. CV: Negative for chest pain, angina, palpitations, dyspnea on exertion, peripheral edema.  Respiratory: Negative for dyspnea at rest, dyspnea on exertion, cough, sputum, wheezing.  GI: See history of present illness. GU:  Negative for dysuria, hematuria, urinary incontinence, urinary frequency, nocturnal urination.  Endo: Negative for unusual weight change.    Physical Examination:   BP 131/78   Pulse 78   LMP 07/02/2018   General: Very thin and cachectic Eyes: No icterus. Conjunctivae pink. Neuro: Alert and oriented x 3.  Grossly intact. Skin: Warm and dry, no jaundice.   Psych: Alert and cooperative, normal mood and affect.   Imaging Studies: No results found.  Assessment and Plan:   MIRKA BARBONE is a 56 y.o. 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  Status post InterStim placement at [facility] for pelvic floor dysfunction which is being managed at [facility] presently .  Seen for second opinion by Dr. Elisha Ponder for the above problems and was placed on azithromycin 3 times a day for over 9 months.  Not receiving any benefit, in addition had elevated LFTs and hence I stopped the azithromycin in 03/21/2021.    Responded well to a course of Xifaxan for IBS diarrhea.  She did try domperidone which helped to a certain extent but we could not get further refills from [place] as the FDA confiscated her medications. I explained I could not provide her more domperidone inform of prescription.  She was seen recently by her GI physician in [facility] who specializes in gastroparesis domperidone stopped and commenced on Mestinon no significant change had a subsequent endoscopy and Endoflip which apparently was  normal.  She was referred to [facility] for a second opinion but the referral was declined.  She has had multiple courses of Xifaxan for bacterial overgrowth syndrome which helps but subsequently the symptoms return.   At this point of time she has lost a lot of weight since her last visit she looks very thin and cachectic and very concerned of severe malnutrition.  Would like to ensure that we are not missing any underlying neoplasm at this point of time and if negative we should probably start her on TPN to help restore her weight while we try to figure out a long-term plan.  Options that I can think about her long-term use of her medication such as Xifaxan on a low-dose versus trial of medical marijuana versus off label use of stool transplant which has had some medical evidence.  Overall the medical evidence for these interventions has been low or few small studies.  I do not know what else we could do to help her with this condition at this point of time.  I probably will offer her while she gains her weight options to have a consultation at [facility] or [facility] [facility].   Plan 1.  CT chest abdomen pelvis to rule out any underlying neoplasm as cause for abnormal weight loss 2.  Advised her to discuss with her physicians at [facility] about future plan and roadmap to deal with her condition 3.  Advised to stop Bactrim as it can cause hyponatremia 4.  Down the road we need to start planning for TPN as she has lost a lot of weight 5.  Long-term antibiotics would need to be a possibly an option 6.  Possible use of Marinol to improve appetite was discussed     Dr [name]  MD,MRCP [facility])     Dr [name]  MD,MRCP [facility]) Follow up in 2 weeks

## 2021-12-15 ENCOUNTER — Encounter: Payer: Self-pay | Admitting: Gastroenterology

## 2021-12-15 NOTE — Telephone Encounter (Signed)
Lets start process with amerita- will plan to start tpn after ct results and discussion with patient at that point

## 2021-12-18 ENCOUNTER — Ambulatory Visit
Admission: RE | Admit: 2021-12-18 | Discharge: 2021-12-18 | Disposition: A | Discharge status: Home / Self Care | Payer: Federal, State, Local not specified - PPO | Source: Ambulatory Visit | Attending: Gastroenterology | Admitting: Gastroenterology

## 2021-12-18 DIAGNOSIS — K3184 Gastroparesis: Secondary | ICD-10-CM | POA: Diagnosis present

## 2021-12-18 DIAGNOSIS — R634 Abnormal weight loss: Secondary | ICD-10-CM | POA: Diagnosis present

## 2021-12-18 MED ORDER — IOHEXOL 300 MG/ML  SOLN
60.0000 mL | Freq: Once | INTRAMUSCULAR | Status: AC | PRN
  Administered 2021-12-18: 60 mL via INTRAVENOUS

## 2021-12-19 NOTE — Progress Notes (Signed)
No significant abnormality

## 2021-12-20 ENCOUNTER — Ambulatory Visit: Payer: Federal, State, Local not specified - PPO

## 2021-12-20 NOTE — Telephone Encounter (Signed)
1. CT was normal except for constipation  2. I spoke to the nutritionist before your appointment yesterday and did explain my rationale for TPN, will speak with the MD when back from bacation 3. Lets wait for thoughts on Marinol by Dr Jacinto Reap. I checked as far as I am aware no medical marijuana in [place] yet but can do Marinol .

## 2021-12-27 ENCOUNTER — Encounter: Payer: Self-pay | Admitting: Gastroenterology

## 2021-12-28 NOTE — Telephone Encounter (Signed)
I havnt heard back from [facility] doc yesterdsay   1 . Can start taking Marinol right now - dont see a reason to wait   2. Codes would be  to perform tests in algorithm below and if positive reflex testing with further tests also in algorithm    [email or web address]  I will be in touch once I hear back from [facility] doc  Dr Tobi Bastos

## 2021-12-29 NOTE — Telephone Encounter (Signed)
1. How much miralax are you using? 2. Ok with marinol lets see how it goes

## 2022-01-03 ENCOUNTER — Encounter: Payer: Self-pay | Admitting: Gastroenterology

## 2022-01-03 NOTE — Telephone Encounter (Signed)
Dr. Tobi Bastos, I apologize for bothering you and I am not even sure your office is in today- July 3.  Three quick questions:   1.  Have not heard from [facility] yet.  While I am waiting, I am anxious to know plan, can you tell me what it is? Also, is it possible to just have nutrition plan that you came up with done at [facility]? 2.  I am really having trouble with miralax-cramps, gas, constipation.  I have tried              a.  Miralax 17g nightly--get constipated and need suppository               b.  Tried miralax 17g nightly and sennakot---gives me lots of gas and cramps                c.  Tried milk of mag one night then miralax 17g the next day.  Still loose stool for most part, less gas           Goal is to try to get more complete stools--is #3 a viable option?  Will try that a few days if it is. 3.  Will the side effect of being sleepy with marinol lessen over time?  dose is 2.5.mg   I can not tolerate 2x a day. I had tried before dinner and also, tried before bed.  Is there a better time to take it for appetite stimulant if only taking once a day. thank you Vanessa Gillespie

## 2022-01-11 DIAGNOSIS — A049 Bacterial intestinal infection, unspecified: Secondary | ICD-10-CM | POA: Insufficient documentation

## 2022-01-11 DIAGNOSIS — K5909 Other constipation: Secondary | ICD-10-CM | POA: Insufficient documentation

## 2022-01-11 DIAGNOSIS — K638219 Small intestinal bacterial overgrowth, unspecified: Secondary | ICD-10-CM | POA: Insufficient documentation

## 2022-02-26 NOTE — Telephone Encounter (Signed)
Noted office visit and plan   1. I have received the note  2. Agree with optioons provided : they are the best in present circumstances  3. Options for AGID testing ? [facility]  4. If you dont get a reply from Dr Sharma Covert let me know next week I will try and contact them   Dr Tobi Bastos

## 2022-02-27 ENCOUNTER — Encounter: Payer: Self-pay | Admitting: Gastroenterology

## 2022-02-28 NOTE — Telephone Encounter (Signed)
Vanessa Gillespie send referral per her instructions If gastric emptying study is normal then could either mean at the time when done on that day it was normal , possible can be abnormal on a different day - your test was abnormal in the past or it may be normal next time as well which would then mean you have only small bowel and colon dysmotility   Dr Tobi Bastos

## 2022-02-28 NOTE — Telephone Encounter (Signed)
Noted.  I agree with the recs, I have seen her note  Dr Tobi Bastos

## 2022-03-02 NOTE — Telephone Encounter (Signed)
1.  Kandis Cocking can we check if the referral has been sent. 2.  I looked at the report it appears that the stomach is squeezing adequately on the medication indicating that the medication is doing job.  Based on this nasogastric feed technically would not be unreasonable to be tried. 3.  I think it would be worth pursuing testing for a AGID since it is the only condition which we could potentially try an intervention to see if it works.  Dr Tobi Bastos

## 2022-03-02 NOTE — Addendum Note (Signed)
Addended by: Adela Ports on: 03/02/2022 11:01 AM   Modules accepted: Orders

## 2022-03-07 DIAGNOSIS — M6289 Other specified disorders of muscle: Secondary | ICD-10-CM | POA: Insufficient documentation

## 2022-03-08 ENCOUNTER — Encounter: Payer: Self-pay | Admitting: Gastroenterology

## 2022-03-09 ENCOUNTER — Other Ambulatory Visit: Payer: Self-pay | Admitting: Gastroenterology

## 2022-03-21 NOTE — Telephone Encounter (Signed)
Can we check if they have diagnosed her with AGID ? Can we get the GI office last note where they are giving IVIG

## 2022-03-30 ENCOUNTER — Encounter: Payer: Self-pay | Admitting: Gastroenterology

## 2022-04-02 DIAGNOSIS — R768 Other specified abnormal immunological findings in serum: Secondary | ICD-10-CM | POA: Insufficient documentation

## 2022-04-04 NOTE — Telephone Encounter (Signed)
Thanks for the update, await the notes and lab results

## 2022-04-09 ENCOUNTER — Ambulatory Visit: Payer: Self-pay | Admitting: Podiatry

## 2022-04-09 ENCOUNTER — Encounter: Payer: Self-pay | Admitting: Gastroenterology

## 2022-04-10 NOTE — Telephone Encounter (Signed)
Ok to order 

## 2022-04-16 DIAGNOSIS — G9089 Other disorders of autonomic nervous system: Secondary | ICD-10-CM | POA: Insufficient documentation

## 2022-04-16 DIAGNOSIS — G908 Other disorders of autonomic nervous system: Secondary | ICD-10-CM | POA: Insufficient documentation

## 2022-04-16 DIAGNOSIS — M3210 Systemic lupus erythematosus, organ or system involvement unspecified: Secondary | ICD-10-CM | POA: Insufficient documentation

## 2022-04-16 DIAGNOSIS — E440 Moderate protein-calorie malnutrition: Secondary | ICD-10-CM | POA: Insufficient documentation

## 2022-04-16 DIAGNOSIS — K929 Disease of digestive system, unspecified: Secondary | ICD-10-CM | POA: Insufficient documentation

## 2022-04-18 ENCOUNTER — Encounter: Payer: Self-pay | Admitting: Gastroenterology

## 2022-04-18 NOTE — Telephone Encounter (Signed)
I saw the labs and they are negative, will await Dr Carmell Austria impression

## 2022-04-21 ENCOUNTER — Encounter: Payer: Self-pay | Admitting: Gastroenterology

## 2022-04-26 NOTE — Telephone Encounter (Signed)
I cannot see Dr Carmell Austria chart online although I can just see the dates of contact. Hopefully the IVIG helps along with octreotide or somatostatin injections

## 2022-05-02 ENCOUNTER — Encounter: Payer: Self-pay | Admitting: Gastroenterology

## 2022-06-11 ENCOUNTER — Encounter: Payer: Self-pay | Admitting: Gastroenterology

## 2022-06-11 NOTE — Telephone Encounter (Signed)
We will have to teach her to give an injection like Humira. If we have an issue the cancer center does it we can find out when she gets the delivery

## 2022-06-18 ENCOUNTER — Encounter: Payer: Self-pay | Admitting: Gastroenterology

## 2022-06-22 ENCOUNTER — Encounter: Payer: Self-pay | Admitting: Gastroenterology

## 2022-06-22 DIAGNOSIS — K5989 Other specified functional intestinal disorders: Secondary | ICD-10-CM | POA: Insufficient documentation

## 2022-06-22 DIAGNOSIS — K529 Noninfective gastroenteritis and colitis, unspecified: Secondary | ICD-10-CM | POA: Insufficient documentation

## 2022-06-27 ENCOUNTER — Encounter: Payer: Self-pay | Admitting: Gastroenterology

## 2022-06-27 ENCOUNTER — Ambulatory Visit (INDEPENDENT_AMBULATORY_CARE_PROVIDER_SITE_OTHER): Payer: Federal, State, Local not specified - PPO | Admitting: Gastroenterology

## 2022-06-27 VITALS — BP 99/62 | HR 66 | Temp 98.3°F | Ht 63.0 in | Wt 84.5 lb

## 2022-06-27 DIAGNOSIS — M6289 Other specified disorders of muscle: Secondary | ICD-10-CM

## 2022-06-27 DIAGNOSIS — K580 Irritable bowel syndrome with diarrhea: Secondary | ICD-10-CM

## 2022-06-27 DIAGNOSIS — K5901 Slow transit constipation: Secondary | ICD-10-CM

## 2022-06-27 DIAGNOSIS — R634 Abnormal weight loss: Secondary | ICD-10-CM | POA: Diagnosis not present

## 2022-06-27 DIAGNOSIS — K9289 Other specified diseases of the digestive system: Secondary | ICD-10-CM

## 2022-06-27 DIAGNOSIS — E871 Hypo-osmolality and hyponatremia: Secondary | ICD-10-CM

## 2022-06-27 NOTE — Progress Notes (Signed)
Vanessa Bellows MD, MRCP(U.K) [address]  [place]  [place], [place] [address]  Main: [phone]  Fax: [phone]   Primary Care Physician: Cecile Sheerer, MD  Primary Gastroenterologist:  Dr. Jonathon Gillespie   Chief Complaint  Patient presents with   Weight Loss    Discuss issues     HPI: Vanessa Gillespie is a 56 y.o. female   Summary of history :   Initially and seen on 10/13/2018 for IBS:  has gone back and forth with diarrhea for a long time and  was diagnosed with a "weak pelvic floor" , lot of bloating and cant pass the gas. Tried gasex and IB guard which has not worked. She has had a hysterectomy , 2 kids , vaginal deliveries, large tear with second baby. Sometimes passing urine is hard. No bulging noted from her vagina. 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 She was having issues with constipation. She has been undergoing pelvic floor strengthening therapy with minimal improvement. Tried linzess caused explosive diarrhea in the beginning along with motegrity - she then stopped motegrity and linzess stopped working. Long time back tried [place].    12/16/2018:colonoscopy :poor anal tone otherwise normal.  Underwent Posterior colporrhaphy, perineorrhaphy surgery on 04/27/2019 with Dr. Orinda Kenner 09/01/2019: Food allergy panel  , b12,folate,TSH- normal .     I referred her to see a specialist and small bowel dysmotility, fecal incontinence and bacterial overgrowth Dr. Janese Banks.  She was seen and evaluated by him in September 2021.  She had a gastric emptying study small bowel study including the whole colonic transit time study gastric emptying was delayed at 10 hours and 46 minutes.  Small bowel transit time was over 9 hours and the colonic transit time was greater than 98 hours the capsule never passed in over 119 hours.   She also underwent an enteroscopy on 03/16/2020 no evidence of Barrett's esophagus moderate amount of food of bile in the gastric body  biopsies were taken in the duodenum Spinal modulator was placed and has taken 2021 with minimal effect.  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     Her Motegrity was stopped and commenced on Linzess for slow transit constipation in addition erythromycin was commenced to speed up gastric emptying.  She was commenced on azithromycin 3 times a day before meals, milk of magnesia nightly.  Subsequently she has reverted back to [employer] then tinidazole for gastric motility as well as bacterial overgrowth syndrome MCT Oil and Citrucel.  Recommended biofeedback for dyssynergy In May 2022 she followed up with [facility] urology had readjusted In July 2022 she tested positive for COVID-19.    03/29/2021: Zinc, vitamin A vitamin D, vitamin e-, vitamin K, copper were normal 04/17/2021: LFTs are improving, GGT 11, CK also Lower at 131 from 181 previously.  Hemoglobin 10.8 g creatinine 0.72 potassium 4.2 sodium 130 magnesium 2.2 had an upper endoscopy and Endoflip procedure which apparently was normal.    We were unable to obtain domperidone from [place] the [place] FDA [place]  She has previously tried to be seen by [facility] but she was refused to be seen   Interval history     12/19/2021 CT chest abdomen pelvis with contrast showed no evidence of malignancy short  segment of jejunum showed small thickening nonspecific moderate stool burden in the colon right hepatic cyst too small to characterize but likely cyst   Since her last visit she has been seen by Dr. Verneda Skill at [facility] recommended continue Motegrity and Mestinon.  Testing for autoimmune gastric dysfunction was done. She was subsequently seen by Dr. Henrene Pastor at [facility] and recommended breath  testing and pelvic floor physical therapy..  It was recommended by Dr. Verneda Skill a trial of IVIG despite autoimmune markers being negative Seen by Dr. Verneda Skill on 06/22/2022, was recommended Remeron for sleep to continue Motegrity, [place] twice daily obtain a new psychologist.  NG tube feeding was discussed in along with down the road referral to colorectal surgery for consideration of colectomy end ileostomy  She was seen at the ER on 06/26/2022 for diarrhea. 06/13/2022: Vitamin D now.  TSH normal.  Hemoglobin 12.5 g.  In July 2023 she weighed 91 pounds, today she weighs 84 pounds.  Not able to tolerate much orally not able to still obtain the octreotide injections to improve small bowel motility. Current Outpatient Medications  Medication Sig Dispense Refill   Aloe Vera 25 MG CAPS Take 1 capsule by mouth 1 day or 1 dose.     baclofen (LIORESAL) 10 MG tablet Take 10 mg by mouth 3 (three) times daily.     diazepam (VALIUM) 10 MG tablet Insert one daily into the vagina as needed for pain. NOT for ORAL USE     estradiol (VIVELLE-DOT) 0.1 MG/24HR patch Place 0.1 mg onto the skin.     gentamicin ointment (GARAMYCIN) 0.1 %      hydrOXYzine (ATARAX) 10 MG tablet Take 1 tablet by mouth daily.     hydrOXYzine (VISTARIL) 25 MG capsule Take 1 capsule by mouth as needed.     levothyroxine (SYNTHROID) 50 MCG tablet Take by mouth.     magnesium hydroxide (MILK OF MAGNESIA) 400 MG/5ML suspension Take 5 mLs by mouth as needed.     MOTEGRITY 2 MG TABS TAKE ONE TABLET BY MOUTH EVERY DAY 90 tablet 5   Nutritional Supplements (BOOST VERY HIGH CALORIE) LIQD Take by mouth.     Pediatric Multivitamins-Iron (FLINTSTONES COMPLETE) 10 MG CHEW Chew by mouth.     PRESCRIPTION MEDICATION Take 1 tablet by mouth daily. domperidone 10 MG     pyridostigmine (MESTINON) 60 MG tablet Take 1 tablet by mouth in the morning, at noon, and at bedtime.     urea (URE-NA) 15 g PACK oral packet Take 1 packet by mouth at  bedtime.     No current facility-administered medications for this visit.    Allergies as of 06/27/2022 - Review Complete 06/27/2022  Allergen Reaction Noted   Sulfur  12/05/2021    ROS:  General: Negative for anorexia, weight loss, fever, chills, fatigue, weakness. ENT: Negative for hoarseness, difficulty swallowing , nasal congestion. CV: Negative for chest pain, angina, palpitations, dyspnea on exertion, peripheral edema.  Respiratory: Negative for dyspnea at rest, dyspnea on exertion, cough, sputum, wheezing.  GI: See history of present illness. GU:  Negative for dysuria, hematuria, urinary incontinence, urinary frequency, nocturnal urination.  Endo: Negative for unusual weight change.    Physical Examination:   BP 99/62   Pulse 66   Temp 98.3 F (36.8 C)   Ht 5\' 3"  (1.6 m)   Wt 84 lb 8 oz (38.3 kg)   LMP 07/02/2018   BMI 14.97 kg/m   General: Very thin appears pale cachectic significant loss  of muscle mass Eyes: No icterus. Conjunctivae pink. Neuro: Alert and oriented x 3.  Grossly intact. Skin: Warm and dry, no jaundice.   Psych: Alert and cooperative, normal mood and affect.   Imaging Studies: No results found.  Assessment and Plan:   Vanessa Gillespie is a 56 y.o. 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  Status post InterStim placement at [facility] for pelvic floor dysfunction which is being managed at [facility] presently .  Seen for second opinion by Dr. Elisha Ponder for the above problems and was placed on azithromycin 3 times a day for over 9 months.  Not receiving any benefit, in addition had elevated LFTs and hence I stopped the azithromycin in 03/21/2021.    Responded well to a course of Xifaxan for IBS diarrhea had multiple recurrences and presently on long-term twice daily dosing.  She did try domperidone which helped to a certain extent but we  could not get further refills from [place] as the FDA confiscated her medications.I explained I could not provide her more domperidone inform of prescription.  She was seen recently by her GI physician in [facility] who specializes in gastroparesis domperidone stopped and commenced on Mestinon no significant change had a subsequent endoscopy and Endoflip which apparently was normal.  She was referred to [facility] for a second opinion but the referral was declined.  She has subsequently been seen by Dr. Margarette Asal at [facility] who did testing for autoimmune gastric dysmotility, test were negative subsequently plan was to start her on octreotide to see if it improves small bowel motility.  She has been seen by [facility] specialist Dr. Marina Goodell was recommended to consider colectomy with ileostomy as well as trial of any GI or NJ tube feeding   Since her last visit she has lost over 10% of her body weight she appears extremely cachectic weak and I am concerned that she is not getting enough calories and she could deteriorate rather rapidly if we do not intervene at this point of time.  Plan 1.  CMP today 2.  Advised her to contact Dr. Marina Goodell at [facility] and commence on tube feeding either NG or NJ feeding, probably to start at a low rate, we discussed about checking residuals and if tolerated gradually increase and monitor body weight.  If she tolerates NG feeding and is not able to increase oral intake then may require a PEG tube or PEG J-tube.  If she is not able to tolerate NG or NJ feeding then would require TPN.  I explained to her that it is possible that once she gains some weight oral intake could help.  I am also concerned at this point of time that with her medical conditions it is affecting her psychologically to a significant extent that it is further making her to lose weight and this is a cycle that we should break by improving her body weight initially.   A total of 67 minutes was spent on this visit  reviewing previous notes, counseling the patient on plan for enteral or parenteral nutrition, discussing about refeeding syndrome and the need for close monitoring when she starts her feeding.  Options if it fails, plan to stop worrying about the octreotide at this point of time as it is not can make a significant difference in the short-term.   Dr [name]  MD,MRCP [facility]) Follow up in as needed

## 2022-06-28 LAB — COMPREHENSIVE METABOLIC PANEL
ALT: 38 IU/L — ABNORMAL HIGH (ref 0–32)
AST: 39 IU/L (ref 0–40)
Albumin/Globulin Ratio: 2.2 (ref 1.2–2.2)
Albumin: 4.3 g/dL (ref 3.8–4.9)
Alkaline Phosphatase: 36 IU/L — ABNORMAL LOW (ref 44–121)
BUN/Creatinine Ratio: 39 — ABNORMAL HIGH (ref 9–23)
BUN: 24 mg/dL (ref 6–24)
Bilirubin Total: <0.2 mg/dL (ref 0.0–1.2)
CO2: 24 mmol/L (ref 20–29)
Calcium: 9.3 mg/dL (ref 8.7–10.2)
Chloride: 96 mmol/L (ref 96–106)
Creatinine, Ser: 0.61 mg/dL (ref 0.57–1.00)
Globulin, Total: 2 g/dL (ref 1.5–4.5)
Glucose: 92 mg/dL (ref 70–99)
Potassium: 4.2 mmol/L (ref 3.5–5.2)
Sodium: 132 mmol/L — ABNORMAL LOW (ref 134–144)
Total Protein: 6.3 g/dL (ref 6.0–8.5)
eGFR: 106 mL/min/{1.73_m2} (ref >59)

## 2022-08-06 ENCOUNTER — Other Ambulatory Visit: Payer: Self-pay

## 2022-08-06 ENCOUNTER — Encounter: Payer: Self-pay | Admitting: Gastroenterology

## 2022-08-06 DIAGNOSIS — K9289 Other specified diseases of the digestive system: Secondary | ICD-10-CM

## 2022-08-06 DIAGNOSIS — R634 Abnormal weight loss: Secondary | ICD-10-CM

## 2022-08-06 DIAGNOSIS — R7989 Other specified abnormal findings of blood chemistry: Secondary | ICD-10-CM

## 2022-08-06 NOTE — Telephone Encounter (Signed)
1. CBC,LFT's , Lipase  2. Have you started TPN?

## 2022-08-07 ENCOUNTER — Other Ambulatory Visit: Payer: Self-pay

## 2022-08-07 ENCOUNTER — Encounter: Payer: Self-pay | Admitting: Gastroenterology

## 2022-08-07 DIAGNOSIS — K3184 Gastroparesis: Secondary | ICD-10-CM

## 2022-08-07 DIAGNOSIS — R197 Diarrhea, unspecified: Secondary | ICD-10-CM

## 2022-08-07 DIAGNOSIS — K2970 Gastritis, unspecified, without bleeding: Secondary | ICD-10-CM

## 2022-08-07 LAB — COMPREHENSIVE METABOLIC PANEL
ALT: 34 IU/L — ABNORMAL HIGH (ref 0–32)
AST: 37 IU/L (ref 0–40)
Albumin/Globulin Ratio: 2.4 — ABNORMAL HIGH (ref 1.2–2.2)
Albumin: 4.3 g/dL (ref 3.8–4.9)
Alkaline Phosphatase: 37 IU/L — ABNORMAL LOW (ref 44–121)
BUN/Creatinine Ratio: 29 — ABNORMAL HIGH (ref 9–23)
BUN: 19 mg/dL (ref 6–24)
Bilirubin Total: 0.2 mg/dL (ref 0.0–1.2)
CO2: 25 mmol/L (ref 20–29)
Calcium: 9.5 mg/dL (ref 8.7–10.2)
Chloride: 99 mmol/L (ref 96–106)
Creatinine, Ser: 0.66 mg/dL (ref 0.57–1.00)
Globulin, Total: 1.8 g/dL (ref 1.5–4.5)
Glucose: 80 mg/dL (ref 70–99)
Potassium: 4.2 mmol/L (ref 3.5–5.2)
Sodium: 140 mmol/L (ref 134–144)
Total Protein: 6.1 g/dL (ref 6.0–8.5)
eGFR: 104 mL/min/{1.73_m2} (ref >59)

## 2022-08-07 LAB — LIPASE: Lipase: 159 U/L — ABNORMAL HIGH (ref 14–72)

## 2022-08-07 LAB — HEPATIC FUNCTION PANEL: Bilirubin, Direct: <0.1 mg/dL (ref 0.00–0.40)

## 2022-08-07 MED ORDER — OMEPRAZOLE 40 MG PO CPDR: 40.0000 mg | DELAYED_RELEASE_CAPSULE | Freq: Every day | ORAL | 3 refills | Status: DC

## 2022-08-07 MED ORDER — SUCRALFATE 1 GM/10ML PO SUSP: 1.0000 g | Freq: Four times a day (QID) | ORAL | 1 refills | Status: DC

## 2022-08-07 NOTE — Telephone Encounter (Signed)
Lets see if you feel better on omeprazole 40 mg a day , can in addition taker carafate QID  Let me know in 2 days how you are feeling

## 2022-08-07 NOTE — Progress Notes (Signed)
Lipase is elevated still doesn't make criteria for acute pancreatitis- how is she doing if still having pain may need CT scan , gastritis can elevate lipase level.

## 2022-08-08 ENCOUNTER — Encounter: Payer: Self-pay | Admitting: Gastroenterology

## 2022-08-09 ENCOUNTER — Encounter: Payer: Self-pay | Admitting: Gastroenterology

## 2022-08-09 ENCOUNTER — Other Ambulatory Visit: Payer: Self-pay

## 2022-08-09 DIAGNOSIS — R1084 Generalized abdominal pain: Secondary | ICD-10-CM

## 2022-08-09 DIAGNOSIS — R634 Abnormal weight loss: Secondary | ICD-10-CM

## 2022-08-09 DIAGNOSIS — K2970 Gastritis, unspecified, without bleeding: Secondary | ICD-10-CM

## 2022-08-09 DIAGNOSIS — K3184 Gastroparesis: Secondary | ICD-10-CM

## 2022-08-09 DIAGNOSIS — R197 Diarrhea, unspecified: Secondary | ICD-10-CM

## 2022-08-09 NOTE — Telephone Encounter (Signed)
Options 1. Wait another 24 hours to see how she is doing  2. Get seen by urgent care/PCP since we are closed  tomorrow and full today  3. We can order a CT scan if she wishes  4. It could all just be a stomach bug that caused diarrhea and now having some bloating after diarrhea resolved.

## 2022-08-10 ENCOUNTER — Ambulatory Visit
Admission: RE | Admit: 2022-08-10 | Discharge: 2022-08-10 | Disposition: A | Discharge status: Home / Self Care | Payer: Federal, State, Local not specified - PPO | Source: Ambulatory Visit | Attending: Gastroenterology | Admitting: Gastroenterology

## 2022-08-10 DIAGNOSIS — R1084 Generalized abdominal pain: Secondary | ICD-10-CM | POA: Insufficient documentation

## 2022-08-10 MED ORDER — IOHEXOL 300 MG/ML  SOLN
75.0000 mL | Freq: Once | INTRAMUSCULAR | Status: AC | PRN
  Administered 2022-08-10: 75 mL via INTRAVENOUS

## 2022-08-11 LAB — GI PROFILE, STOOL, PCR

## 2022-08-11 LAB — LIPASE: Lipase: 132 U/L — ABNORMAL HIGH (ref 14–72)

## 2022-08-13 ENCOUNTER — Other Ambulatory Visit: Payer: Self-pay

## 2022-08-13 DIAGNOSIS — R197 Diarrhea, unspecified: Secondary | ICD-10-CM

## 2022-08-13 NOTE — Telephone Encounter (Signed)
Cancel the appointment

## 2022-08-13 NOTE — Telephone Encounter (Signed)
Per Dr. Vicente Males We can order a CMP and BMP if patient wants. If she wants to come in we can see her on Wednesday at 8:30 . If she does not want to come in then we can order a EGD and Sucure breath test. Patient states she wants to come in the office and order the lab work

## 2022-08-13 NOTE — Addendum Note (Signed)
Addended by: Ulyess Blossom L on: 08/13/2022 11:12 AM   Modules accepted: Orders

## 2022-08-14 LAB — COMPREHENSIVE METABOLIC PANEL
ALT: 37 IU/L — ABNORMAL HIGH (ref 0–32)
AST: 39 IU/L (ref 0–40)
Albumin/Globulin Ratio: 2.2 (ref 1.2–2.2)
Albumin: 4.4 g/dL (ref 3.8–4.9)
Alkaline Phosphatase: 37 IU/L — ABNORMAL LOW (ref 44–121)
BUN/Creatinine Ratio: 32 — ABNORMAL HIGH (ref 9–23)
BUN: 20 mg/dL (ref 6–24)
Bilirubin Total: <0.2 mg/dL (ref 0.0–1.2)
CO2: 26 mmol/L (ref 20–29)
Calcium: 9.2 mg/dL (ref 8.7–10.2)
Chloride: 99 mmol/L (ref 96–106)
Creatinine, Ser: 0.63 mg/dL (ref 0.57–1.00)
Globulin, Total: 2 g/dL (ref 1.5–4.5)
Glucose: 87 mg/dL (ref 70–99)
Potassium: 4.3 mmol/L (ref 3.5–5.2)
Sodium: 136 mmol/L (ref 134–144)
Total Protein: 6.4 g/dL (ref 6.0–8.5)
eGFR: 105 mL/min/{1.73_m2} (ref >59)

## 2022-08-15 ENCOUNTER — Ambulatory Visit: Payer: Federal, State, Local not specified - PPO | Admitting: Gastroenterology

## 2022-08-15 ENCOUNTER — Telehealth: Payer: Self-pay

## 2022-08-15 ENCOUNTER — Encounter: Payer: Self-pay | Admitting: Gastroenterology

## 2022-08-15 NOTE — Telephone Encounter (Signed)
Patient called and left me a voicemail letting me know that she had sent Dr. Vicente Males 3 MyChart messages and had not gotten a response. She stated that she is concerned about having gastritis as a message sent to her suggested that she might have gastritis. She also wanted to know if she is to restart taking Omeprazole since she was feeling bloated again or if she is to take something elso for gastritis? Patient also would like to know if she was tested for H pylori as she stated that something is going on with GI tract. Patient would like a response soon because she continues to have abdominal pain. Please advise.

## 2022-08-16 ENCOUNTER — Other Ambulatory Visit: Payer: Self-pay

## 2022-08-16 DIAGNOSIS — R197 Diarrhea, unspecified: Secondary | ICD-10-CM

## 2022-08-16 NOTE — Telephone Encounter (Signed)
1. Can take omeprazole 20 mg once a day or twice a day , if that doesn't work in a week can increase to 40 BID.  2. Vanessa Gillespie order stool test for H pylori- blood test not recommended any more

## 2022-08-17 MED ORDER — OMEPRAZOLE 20 MG PO CPDR: 20.0000 mg | DELAYED_RELEASE_CAPSULE | Freq: Every day | ORAL | 3 refills | Status: DC

## 2022-08-18 LAB — GI PROFILE, STOOL, PCR

## 2022-08-20 ENCOUNTER — Telehealth: Payer: Self-pay

## 2022-08-20 NOTE — Progress Notes (Signed)
Negative stool

## 2022-08-20 NOTE — Telephone Encounter (Signed)
Informed by mychart  

## 2022-08-20 NOTE — Telephone Encounter (Signed)
-----   Message from Jonathon Bellows, MD sent at 08/20/2022  7:49 AM EST ----- Negative stool

## 2022-08-22 ENCOUNTER — Encounter: Payer: Self-pay | Admitting: Gastroenterology

## 2022-08-22 NOTE — Telephone Encounter (Signed)
1. If pain persists and Ppi not helping next step would be EGD to be sure we are suspecting the right reason for the symptoms or if there is anything else causing symptoms   2. Can't treat without positive test as we again have to check for eradication to confirm its gone

## 2022-08-24 NOTE — Telephone Encounter (Signed)
1. Yes can come and go for a while  2. Need to test for H pylori hard to know otherwise

## 2022-08-31 ENCOUNTER — Encounter: Payer: Self-pay | Admitting: Gastroenterology

## 2022-08-31 NOTE — Telephone Encounter (Signed)
We have only two options to check or not to check . We can go either way . We can wait and watch if she has no symptoms or check her h pylori which is an easy test

## 2022-09-02 LAB — H. PYLORI ANTIGEN, STOOL: H pylori Ag, Stl: NEGATIVE

## 2022-09-03 NOTE — Progress Notes (Signed)
negative

## 2022-09-04 NOTE — Telephone Encounter (Signed)
No H pylori , if pain returns , restart PPI otherwise stop . Gastritis has multiple reasons one of which is H pylori .

## 2022-09-07 ENCOUNTER — Encounter: Payer: Self-pay | Admitting: Gastroenterology

## 2022-09-19 ENCOUNTER — Encounter: Payer: Self-pay | Admitting: Gastroenterology

## 2022-09-19 DIAGNOSIS — R197 Diarrhea, unspecified: Secondary | ICD-10-CM

## 2022-09-19 DIAGNOSIS — R5383 Other fatigue: Secondary | ICD-10-CM

## 2022-09-19 DIAGNOSIS — R1084 Generalized abdominal pain: Secondary | ICD-10-CM

## 2022-09-19 NOTE — Telephone Encounter (Signed)
See other message recs

## 2022-09-20 ENCOUNTER — Encounter: Payer: Self-pay | Admitting: Gastroenterology

## 2022-09-20 LAB — COMPREHENSIVE METABOLIC PANEL
ALT: 31 IU/L (ref 0–32)
AST: 36 IU/L (ref 0–40)
Albumin/Globulin Ratio: 2.7 — ABNORMAL HIGH (ref 1.2–2.2)
Albumin: 4.3 g/dL (ref 3.8–4.9)
Alkaline Phosphatase: 39 IU/L — ABNORMAL LOW (ref 44–121)
BUN/Creatinine Ratio: 29 — ABNORMAL HIGH (ref 9–23)
BUN: 17 mg/dL (ref 6–24)
Bilirubin Total: <0.2 mg/dL (ref 0.0–1.2)
CO2: 21 mmol/L (ref 20–29)
Calcium: 9.4 mg/dL (ref 8.7–10.2)
Chloride: 95 mmol/L — ABNORMAL LOW (ref 96–106)
Creatinine, Ser: 0.58 mg/dL (ref 0.57–1.00)
Globulin, Total: 1.6 g/dL (ref 1.5–4.5)
Glucose: 91 mg/dL (ref 70–99)
Potassium: 4.2 mmol/L (ref 3.5–5.2)
Sodium: 132 mmol/L — ABNORMAL LOW (ref 134–144)
Total Protein: 5.9 g/dL — ABNORMAL LOW (ref 6.0–8.5)
eGFR: 107 mL/min/{1.73_m2} (ref >59)

## 2022-09-20 LAB — CBC
Hematocrit: 34.6 % (ref 34.0–46.6)
Hemoglobin: 12 g/dL (ref 11.1–15.9)
MCH: 32.1 pg (ref 26.6–33.0)
MCHC: 34.7 g/dL (ref 31.5–35.7)
MCV: 93 fL (ref 79–97)
Platelets: 171 10*3/uL (ref 150–450)
RBC: 3.74 x10E6/uL — ABNORMAL LOW (ref 3.77–5.28)
RDW: 12 % (ref 11.7–15.4)
WBC: 4.9 10*3/uL (ref 3.4–10.8)

## 2022-09-20 LAB — TSH: TSH: 0.898 u[IU]/mL (ref 0.450–4.500)

## 2022-09-20 NOTE — Telephone Encounter (Signed)
I am concerned protein levels dropping due to malnutrition . Multivitamin wont help. Low alk phos can also be seen with malnutrition.   I suggest getting in some feeding ? TPN as we last discussed to get your weight up .

## 2022-09-21 NOTE — Telephone Encounter (Signed)
1. Can try boost  2. Please discuss TPN with the doc at [facility] 3. Diet is always best for nutrition but if failing no other option 4. We can repeat labs at your wishes no issues

## 2022-09-27 ENCOUNTER — Encounter: Payer: Self-pay | Admitting: Gastroenterology

## 2022-10-01 ENCOUNTER — Telehealth: Payer: Self-pay

## 2022-10-01 NOTE — Telephone Encounter (Signed)
Please read patient's patient message from today 10/01/2022.

## 2022-10-01 NOTE — Telephone Encounter (Signed)
Patient left a voicemail on Friday. She states she sent Dr. Vicente Males a message on Voicemail and really needs something called in for her.

## 2022-10-02 ENCOUNTER — Encounter: Payer: Self-pay | Admitting: Gastroenterology

## 2022-10-03 ENCOUNTER — Other Ambulatory Visit: Payer: Self-pay

## 2022-10-03 ENCOUNTER — Encounter: Payer: Self-pay | Admitting: Gastroenterology

## 2022-10-03 ENCOUNTER — Ambulatory Visit: Payer: Federal, State, Local not specified - PPO | Admitting: Gastroenterology

## 2022-10-03 VITALS — BP 97/62 | HR 66 | Temp 98.7°F | Wt 85.0 lb

## 2022-10-03 DIAGNOSIS — K602 Anal fissure, unspecified: Secondary | ICD-10-CM | POA: Diagnosis not present

## 2022-10-03 NOTE — Telephone Encounter (Signed)
Honestly I do not know much about urological issues and bladder issues management . Will evaluate fissure today and see what best we can do

## 2022-10-03 NOTE — Progress Notes (Signed)
Jonathon Bellows MD, MRCP(U.K) [address]  [place]  [place], [place] [address]  Main: [phone]  Fax: [phone]   Primary Care Physician: Cecile Sheerer, MD  Primary Gastroenterologist:  Dr. Jonathon Bellows   Chief Complaint  Patient presents with   Anal Fissure    HPI: Vanessa Gillespie is a 57 y.o. female Summary of history :   Initially and seen on 10/13/2018 for IBS:  has gone back and forth with diarrhea for a long time and  was diagnosed with a "weak pelvic floor" , lot of bloating and cant pass the gas. Tried gasex and IB guard which has not worked. She has had a hysterectomy , 2 kids , vaginal deliveries, large tear with second baby. Sometimes passing urine is hard. No bulging noted from her vagina. 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 She was having issues with constipation. She has been undergoing pelvic floor strengthening therapy with minimal improvement. 12/16/2018:colonoscopy :poor anal tone otherwise normal.  Underwent Posterior colporrhaphy, perineorrhaphy surgery on 04/27/2019 with Dr. Orinda Kenner 09/01/2019: Food allergy panel  , b12,folate,TSH- normal .   Tried linzess caused explosive diarrhea in the beginning along with motegrity - she then stopped motegrity and linzess stopped working. Long time back tried [place].        I referred her to see a specialist and small bowel dysmotility, fecal incontinence and bacterial overgrowth Dr. Janese Banks.  She was seen and evaluated by him in September 2021.  She had a gastric emptying study small bowel study including the whole colonic transit time study gastric emptying was delayed at 10 hours and 46 minutes.  Small bowel transit time was over 9 hours and the colonic transit time was greater than 98 hours the capsule never passed in over 119 hours.   She also underwent an enteroscopy on 03/16/2020 no evidence of Barrett's esophagus moderate amount of food of bile in the gastric body biopsies were taken  in the duodenum Spinal modulator was placed and has taken 2021 with minimal effect.  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     Her Motegrity was stopped and commenced on Linzess for slow transit constipation in addition erythromycin was commenced to speed up gastric emptying.  She was commenced on azithromycin 3 times a day before meals, milk of magnesia nightly.  Subsequently she has reverted back to [employer] then tinidazole for gastric motility as well as bacterial overgrowth syndrome MCT Oil and Citrucel.  Recommended biofeedback for dyssynergy In May 2022 she followed up with [facility] urology had readjusted In July 2022 she tested positive for COVID-19.    Had an upper endoscopy and Endoflip procedure which apparently was normal.    We were unable to obtain domperidone from [place]   She has previously tried to be seen by [facility] but she was refused to be seen 12/19/2021 CT chest abdomen pelvis with contrast showed no evidence of malignancy short segment of jejunum showed small thickening nonspecific moderate stool burden in the colon right hepatic cyst too small to characterize but likely cyst     She has been seen by Dr. Verneda Skill at [facility] recommended continue Motegrity and Mestinon.  Testing for autoimmune gastric dysfunction was done.Remeron for sleep  to continue Motegrity, Mestinon Xifaxan twice daily obtain a new psychologist.  NG tube feeding was discussed in along with down the road referral to colorectal surgery for consideration of colectomy end ileostomy She was subsequently seen by Dr. Henrene Pastor at [facility] and recommended breath testing and pelvic floor physical therapy..  It was recommended by Dr.  Verneda Skill a trial of IVIG despite autoimmune markers being negative   Interval history   06/27/2022-10/03/2022   She is here today to discuss about anal fissure with severe pain during defecation recently having some diarrhea.  Weight is 85 pounds but she had more clothing on today than usual  Current Outpatient Medications  Medication Sig Dispense Refill   Aloe Vera 25 MG CAPS Take 1 capsule by mouth 1 day or 1 dose.     baclofen (LIORESAL) 10 MG tablet Take 10 mg by mouth 3 (three) times daily.     diazepam (VALIUM) 10 MG tablet Insert one daily into the vagina as needed for pain. NOT for ORAL USE     estradiol (ESTRACE) 0.1 MG/GM vaginal cream Place 1 Applicatorful vaginally once a week.     estradiol (VIVELLE-DOT) 0.1 MG/24HR patch Place 0.1 mg onto the skin.     gentamicin ointment (GARAMYCIN) 0.1 %      hydrOXYzine (ATARAX) 10 MG tablet Take 1 tablet by mouth daily.     hydrOXYzine (VISTARIL) 25 MG capsule Take 1 capsule by mouth as needed.     levothyroxine (SYNTHROID) 50 MCG tablet Take by mouth.     magnesium hydroxide (MILK OF MAGNESIA) 400 MG/5ML suspension Take 5 mLs by mouth as needed.     MOTEGRITY 2 MG TABS TAKE ONE TABLET BY MOUTH EVERY DAY 90 tablet 5   Nutritional Supplements (BOOST VERY HIGH CALORIE) LIQD Take by mouth.     omeprazole (PRILOSEC) 20 MG capsule Take 1 capsule (20 mg total) by mouth daily. 90 capsule 3   Pediatric Multivitamins-Iron (FLINTSTONES COMPLETE) 10 MG CHEW Chew by mouth.     PRESCRIPTION MEDICATION Take 1 tablet by mouth daily. domperidone 10 MG     pyridostigmine (MESTINON) 60 MG tablet Take 1 tablet by mouth in the morning, at noon, and at bedtime.     sucralfate (CARAFATE) 1 GM/10ML suspension Take 10 mLs (1 g total) by mouth 4 (four) times daily. 420 mL 1   urea (URE-NA) 15 g PACK oral packet Take 1 packet by mouth at bedtime.     No current facility-administered medications for this visit.    Allergies as of 10/03/2022 - Review Complete  10/03/2022  Allergen Reaction Noted   Elemental sulfur Other (See Comments) 12/05/2021   Sulfa antibiotics  10/03/2022   Sulfur  12/05/2021   Sulfamethoxazole-trimethoprim Anxiety, Other (See Comments), Palpitations, and Diarrhea 12/05/2021    ROS:  General: Negative for anorexia, weight loss, fever, chills, fatigue, weakness. ENT: Negative for hoarseness, difficulty swallowing , nasal congestion. CV: Negative for chest pain, angina, palpitations, dyspnea on exertion, peripheral edema.  Respiratory: Negative for dyspnea at rest, dyspnea on exertion, cough, sputum, wheezing.  GI: See history of present illness. GU:  Negative for dysuria, hematuria, urinary incontinence, urinary frequency, nocturnal urination.  Endo: Negative for unusual weight change.    Physical Examination:   BP 97/62   Pulse 66   Temp 98.7 F (37.1 C) (Oral)   Wt 85 lb (38.6 kg)   LMP 07/02/2018   BMI 15.06 kg/m   General: Very thin and cachectic sarcopenia Eyes: No icterus.  Conjunctivae pink. Appears very thin and cachectic With chaperone in the room perianal area.  Mildly edematous severe discomfort and try to insert my finger in the posterior canal of the anus suggesting an anal fissure.  Imaging Studies: No results found.  Assessment and Plan:   Vanessa Gillespie is a 57 y.o. y/o female female with a history of recurrent episodes of SIBO, small bowel dysmotility extensive evaluation losing weight exhausted all options of enteral nutrition strongly have suggested to consider TPN/NG feeding at [facility] she is established with.  She is losing muscle mass and I am very concerned about her long-term health unless she gains some nutrition soon.Marland Kitchen  Here to see me for an anal fissure.  Confirmed on examination   Plan 1.  Topical nifedipine cream has been sent to compounding pharmacy 2.  Discussed about conservative management 3.  If no better will require Botox injection with general surgeons 4.  Discuss with  [facility] to commence on TPN or NJ feeding ASAP  Dr Jonathon Bellows  MD,MRCP [facility]) Follow up as needed

## 2022-11-19 ENCOUNTER — Other Ambulatory Visit: Payer: Self-pay

## 2022-11-19 ENCOUNTER — Encounter: Payer: Self-pay | Admitting: Gastroenterology

## 2022-11-19 DIAGNOSIS — R197 Diarrhea, unspecified: Secondary | ICD-10-CM

## 2022-11-19 NOTE — Telephone Encounter (Signed)
Ok cbc and bmp

## 2022-11-21 LAB — CBC WITH DIFFERENTIAL/PLATELET
Basophils Absolute: 0 10*3/uL (ref 0.0–0.2)
Basos: 1 %
EOS (ABSOLUTE): 0.1 10*3/uL (ref 0.0–0.4)
Eos: 2 %
Hematocrit: 35.3 % (ref 34.0–46.6)
Hemoglobin: 12 g/dL (ref 11.1–15.9)
Immature Grans (Abs): 0.1 10*3/uL (ref 0.0–0.1)
Immature Granulocytes: 1 %
Lymphocytes Absolute: 1.3 10*3/uL (ref 0.7–3.1)
Lymphs: 28 %
MCH: 30.9 pg (ref 26.6–33.0)
MCHC: 34 g/dL (ref 31.5–35.7)
MCV: 91 fL (ref 79–97)
Monocytes Absolute: 0.5 10*3/uL (ref 0.1–0.9)
Monocytes: 10 %
Neutrophils Absolute: 2.7 10*3/uL (ref 1.4–7.0)
Neutrophils: 58 %
Platelets: 183 10*3/uL (ref 150–450)
RBC: 3.88 x10E6/uL (ref 3.77–5.28)
RDW: 12.3 % (ref 11.7–15.4)
WBC: 4.7 10*3/uL (ref 3.4–10.8)

## 2022-11-21 LAB — BASIC METABOLIC PANEL
BUN/Creatinine Ratio: 25 — ABNORMAL HIGH (ref 9–23)
BUN: 16 mg/dL (ref 6–24)
CO2: 23 mmol/L (ref 20–29)
Calcium: 9.4 mg/dL (ref 8.7–10.2)
Chloride: 94 mmol/L — ABNORMAL LOW (ref 96–106)
Creatinine, Ser: 0.65 mg/dL (ref 0.57–1.00)
Glucose: 89 mg/dL (ref 70–99)
Potassium: 4.4 mmol/L (ref 3.5–5.2)
Sodium: 131 mmol/L — ABNORMAL LOW (ref 134–144)
eGFR: 104 mL/min/{1.73_m2} (ref >59)

## 2022-11-22 ENCOUNTER — Other Ambulatory Visit: Payer: Self-pay

## 2022-11-22 DIAGNOSIS — R197 Diarrhea, unspecified: Secondary | ICD-10-CM

## 2022-11-22 NOTE — Telephone Encounter (Signed)
If having diarrhea then lets check stool , can use some imodium .   Again I stress need for TPN which would help with nutrition and fluids.

## 2022-11-25 LAB — C DIFFICILE TOXINS A+B W/RFLX: C difficile Toxins A+B, EIA: NEGATIVE

## 2022-11-26 LAB — GI PROFILE, STOOL, PCR
Astrovirus: NOT DETECTED
C difficile toxin A/B: NOT DETECTED
Campylobacter: NOT DETECTED
Cyclospora cayetanensis: NOT DETECTED
Entamoeba histolytica: NOT DETECTED
Giardia lamblia: NOT DETECTED

## 2022-11-26 LAB — CALPROTECTIN, FECAL

## 2022-11-27 LAB — C DIFFICILE, CYTOTOXIN B

## 2022-11-27 NOTE — Progress Notes (Signed)
Stool tests negative

## 2022-11-30 ENCOUNTER — Encounter: Payer: Self-pay | Admitting: Gastroenterology

## 2022-12-03 ENCOUNTER — Ambulatory Visit
Admission: RE | Admit: 2022-12-03 | Discharge: 2022-12-03 | Disposition: A | Discharge status: Home / Self Care | Payer: Federal, State, Local not specified - PPO | Source: Ambulatory Visit | Attending: Gastroenterology | Admitting: Gastroenterology

## 2022-12-03 ENCOUNTER — Telehealth: Payer: Self-pay

## 2022-12-03 ENCOUNTER — Other Ambulatory Visit: Payer: Self-pay

## 2022-12-03 DIAGNOSIS — R1084 Generalized abdominal pain: Secondary | ICD-10-CM

## 2022-12-03 DIAGNOSIS — K602 Anal fissure, unspecified: Secondary | ICD-10-CM

## 2022-12-03 MED ORDER — PEG 3350-KCL-NA BICARB-NACL 420 G PO SOLR: ORAL | 0 refills | Status: AC

## 2022-12-03 NOTE — Telephone Encounter (Signed)
Per pt her order isn't stat . Pt states she was told we needed to call to make it stat.

## 2022-12-03 NOTE — Telephone Encounter (Signed)
CLINICAL DATA: Abdominal pain, bloating  EXAM: ABDOMEN - 2 VIEW  COMPARISON: None Available.  FINDINGS: The bowel gas pattern is normal. There is no evidence of free air. No radio-opaque calculi or other significant radiographic abnormality is seen. Visualized lungs clear.  IMPRESSION: Negative     Nothing much in the colon just air , can take 1/4th golytely or lesser to help move any stool , low fodmap diet , charcoal tabs as needed

## 2022-12-03 NOTE — Telephone Encounter (Signed)
I would do a clean out with some golytely - to get all the stool out followed by the gas - I would try that first provided she has had no vomiting - if has vomiting then first get x ray abdomen erect then will decide on next step . Office schedule is full as I am on call this week. Lets see what she says to above recs

## 2022-12-04 ENCOUNTER — Encounter: Payer: Self-pay | Admitting: Gastroenterology

## 2022-12-04 LAB — GI PROFILE, STOOL, PCR
Adenovirus F 40/41: NOT DETECTED
Cryptosporidium: NOT DETECTED
Enteroaggregative E coli: NOT DETECTED
Enteropathogenic E coli: NOT DETECTED
Enterotoxigenic E coli: NOT DETECTED
Norovirus GI/GII: NOT DETECTED
Plesiomonas shigelloides: NOT DETECTED
Rotavirus A: NOT DETECTED
Salmonella: NOT DETECTED
Sapovirus: NOT DETECTED
Shiga-toxin-producing E coli: NOT DETECTED
Shigella/Enteroinvasive E coli: NOT DETECTED
Vibrio cholerae: NOT DETECTED
Vibrio: NOT DETECTED
Yersinia enterocolitica: NOT DETECTED

## 2022-12-04 NOTE — Telephone Encounter (Signed)
There is not much stool , the gas , bloating and diarrhea could be from SIBO we could do a course of xifaxan to see if it helps.

## 2022-12-04 NOTE — Telephone Encounter (Signed)
Will do it 3 times a day for 2 weeks then 2 times a day.  It supposed to help with the diarrhea not make it usually worse

## 2022-12-04 NOTE — Telephone Encounter (Signed)
The order was called in as STAT, Patient was made aware.

## 2022-12-05 ENCOUNTER — Encounter: Payer: Self-pay | Admitting: Gastroenterology

## 2022-12-05 NOTE — Telephone Encounter (Signed)
Will do it 3 times a day for 2 weeks then 2 times a day.  It supposed to help with the diarrhea not make it usually worse  Response is still the same as above message I sent 19 hours back . Please tell her to avoid multiple messages as we get confused as to which is the actual message we have to look for and reply and can get confusing and . after a message has to wait for 24-48 hours before next one to get a reply . Sometimes we are not checking messages all the time

## 2022-12-24 ENCOUNTER — Encounter: Payer: Self-pay | Admitting: Gastroenterology

## 2022-12-25 ENCOUNTER — Other Ambulatory Visit: Payer: Self-pay

## 2022-12-25 MED ORDER — ONDANSETRON HCL 4 MG PO TABS: 4.0000 mg | ORAL_TABLET | Freq: Four times a day (QID) | ORAL | 1 refills | Status: AC | PRN

## 2023-03-06 ENCOUNTER — Encounter: Payer: Self-pay | Admitting: Gastroenterology

## 2023-03-18 ENCOUNTER — Other Ambulatory Visit: Payer: Self-pay | Admitting: Gastroenterology

## 2023-03-18 ENCOUNTER — Encounter: Payer: Self-pay | Admitting: Gastroenterology

## 2023-03-18 MED ORDER — MOTEGRITY 2 MG PO TABS: 1.0000 | ORAL_TABLET | Freq: Every day | ORAL | 1 refills | Status: AC

## 2023-03-18 NOTE — Telephone Encounter (Signed)
Last office visit 10/03/2022  Last refill 03/12/2022 90 tablets 5 refills

## 2023-04-01 ENCOUNTER — Encounter: Payer: Self-pay | Admitting: Gastroenterology

## 2023-04-08 ENCOUNTER — Encounter: Payer: Self-pay | Admitting: Gastroenterology

## 2023-05-20 ENCOUNTER — Encounter: Payer: Self-pay | Admitting: Gastroenterology

## 2023-05-21 ENCOUNTER — Other Ambulatory Visit: Payer: Self-pay

## 2023-05-21 MED ORDER — SUCRALFATE 1 GM/10ML PO SUSP: 1.0000 g | Freq: Four times a day (QID) | ORAL | 11 refills | Status: AC

## 2023-07-24 ENCOUNTER — Encounter: Payer: Self-pay | Admitting: Gastroenterology

## 2023-08-06 ENCOUNTER — Encounter: Payer: Self-pay | Admitting: Gastroenterology

## 2023-08-06 ENCOUNTER — Telehealth: Payer: Self-pay | Admitting: Gastroenterology

## 2023-08-06 NOTE — Telephone Encounter (Signed)
The patient called in to get a refill for her cream because the one she has expire. The name is (Nifedipine) and her pharmacy is [facility] on [address] [place], [place], [place] [address].

## 2023-08-06 NOTE — Telephone Encounter (Signed)
The patient called back in for her refill with the cream.

## 2023-08-08 ENCOUNTER — Encounter: Payer: Self-pay | Admitting: Gastroenterology

## 2023-08-13 ENCOUNTER — Encounter: Payer: Self-pay | Admitting: Gastroenterology

## 2023-08-13 NOTE — Telephone Encounter (Signed)
[facility] [address], [address] [place], [place] [address] Fax: [phone]

## 2023-08-22 ENCOUNTER — Encounter: Payer: Self-pay | Admitting: Gastroenterology

## 2023-08-22 ENCOUNTER — Other Ambulatory Visit: Payer: Self-pay

## 2023-08-22 DIAGNOSIS — R197 Diarrhea, unspecified: Secondary | ICD-10-CM

## 2023-08-22 DIAGNOSIS — R140 Abdominal distension (gaseous): Secondary | ICD-10-CM

## 2023-08-23 LAB — COMPREHENSIVE METABOLIC PANEL
ALT: 29 [IU]/L (ref 0–32)
AST: 35 [IU]/L (ref 0–40)
Albumin: 4.4 g/dL (ref 3.8–4.9)
Alkaline Phosphatase: 39 [IU]/L — ABNORMAL LOW (ref 44–121)
BUN/Creatinine Ratio: 22 (ref 9–23)
BUN: 13 mg/dL (ref 6–24)
Bilirubin Total: <0.2 mg/dL (ref 0.0–1.2)
CO2: 27 mmol/L (ref 20–29)
Calcium: 9.1 mg/dL (ref 8.7–10.2)
Chloride: 96 mmol/L (ref 96–106)
Creatinine, Ser: 0.6 mg/dL (ref 0.57–1.00)
Globulin, Total: 2.2 g/dL (ref 1.5–4.5)
Glucose: 75 mg/dL (ref 70–99)
Potassium: 4.2 mmol/L (ref 3.5–5.2)
Sodium: 133 mmol/L — ABNORMAL LOW (ref 134–144)
Total Protein: 6.6 g/dL (ref 6.0–8.5)
eGFR: 105 mL/min/{1.73_m2} (ref >59)

## 2023-08-29 ENCOUNTER — Other Ambulatory Visit: Payer: Self-pay | Admitting: Gastroenterology

## 2023-09-11 ENCOUNTER — Encounter: Payer: Self-pay | Admitting: Gastroenterology

## 2023-11-22 ENCOUNTER — Other Ambulatory Visit: Payer: Self-pay

## 2023-11-22 DIAGNOSIS — R634 Abnormal weight loss: Secondary | ICD-10-CM

## 2023-11-22 DIAGNOSIS — K9289 Other specified diseases of the digestive system: Secondary | ICD-10-CM

## 2023-11-22 DIAGNOSIS — R140 Abdominal distension (gaseous): Secondary | ICD-10-CM

## 2023-11-22 DIAGNOSIS — R197 Diarrhea, unspecified: Secondary | ICD-10-CM

## 2023-11-23 LAB — SODIUM: Sodium: 130 mmol/L — ABNORMAL LOW (ref 134–144)

## 2023-11-25 ENCOUNTER — Other Ambulatory Visit: Payer: Self-pay | Admitting: Gastroenterology

## 2023-11-26 ENCOUNTER — Other Ambulatory Visit: Payer: Self-pay

## 2023-11-26 DIAGNOSIS — R7989 Other specified abnormal findings of blood chemistry: Secondary | ICD-10-CM

## 2023-11-27 ENCOUNTER — Other Ambulatory Visit: Payer: Self-pay

## 2023-11-27 DIAGNOSIS — K9289 Other specified diseases of the digestive system: Secondary | ICD-10-CM

## 2023-11-27 DIAGNOSIS — R140 Abdominal distension (gaseous): Secondary | ICD-10-CM

## 2023-11-27 DIAGNOSIS — R197 Diarrhea, unspecified: Secondary | ICD-10-CM

## 2023-11-27 DIAGNOSIS — R1084 Generalized abdominal pain: Secondary | ICD-10-CM

## 2023-11-27 LAB — COMPREHENSIVE METABOLIC PANEL WITH GFR
ALT: 51 IU/L — ABNORMAL HIGH (ref 0–32)
AST: 45 IU/L — ABNORMAL HIGH (ref 0–40)
Albumin: 4 g/dL (ref 3.8–4.9)
Alkaline Phosphatase: 43 IU/L — ABNORMAL LOW (ref 44–121)
BUN/Creatinine Ratio: 27 — ABNORMAL HIGH (ref 9–23)
BUN: 17 mg/dL (ref 6–24)
Bilirubin Total: <0.2 mg/dL (ref 0.0–1.2)
CO2: 22 mmol/L (ref 20–29)
Calcium: 9.1 mg/dL (ref 8.7–10.2)
Chloride: 95 mmol/L — ABNORMAL LOW (ref 96–106)
Creatinine, Ser: 0.62 mg/dL (ref 0.57–1.00)
Globulin, Total: 3.3 g/dL (ref 1.5–4.5)
Glucose: 93 mg/dL (ref 70–99)
Potassium: 4.2 mmol/L (ref 3.5–5.2)
Sodium: 131 mmol/L — ABNORMAL LOW (ref 134–144)
Total Protein: 7.3 g/dL (ref 6.0–8.5)
eGFR: 104 mL/min/{1.73_m2} (ref >59)

## 2023-11-28 LAB — GAMMA GT: GGT: 8 IU/L (ref 0–60)

## 2023-11-29 ENCOUNTER — Other Ambulatory Visit: Payer: Self-pay

## 2023-11-29 DIAGNOSIS — R140 Abdominal distension (gaseous): Secondary | ICD-10-CM

## 2023-11-29 DIAGNOSIS — R197 Diarrhea, unspecified: Secondary | ICD-10-CM

## 2023-11-29 DIAGNOSIS — R1084 Generalized abdominal pain: Secondary | ICD-10-CM

## 2023-11-29 DIAGNOSIS — K9289 Other specified diseases of the digestive system: Secondary | ICD-10-CM

## 2023-12-02 ENCOUNTER — Ambulatory Visit: Payer: Self-pay

## 2023-12-03 NOTE — Telephone Encounter (Signed)
 Left vm informing pt of lab results per Dr. Antony Baumgartner. Pt advised Dr. Antony Baumgartner is now at [facility] and will not be receiving any further messages. Advised her to contact [facility] in a week or two to schedule an appt and get established with their office.

## 2023-12-03 NOTE — Telephone Encounter (Signed)
-----   Message from Luke Salaam sent at 11/30/2023  9:12 AM EDT ----- Regarding: FW: With ggt and ck normal indicates there is no active inflammation of the liver and muscle . With Lfts not very elevated , we can just watch closely and get labs repeated in 4-6weeks ----- Message ----- From: Interface, [facility] Lab Results In Sent: 11/28/2023   7:37 AM EDT To: Luke Salaam, MD

## 2023-12-21 LAB — CK: Total CK: 100 U/L (ref 32–182)

## 2023-12-21 LAB — SPECIMEN STATUS REPORT

## 2024-02-26 IMAGING — CT CT CHEST-ABD-PELV W/ CM
2 of 9 series · 8 of 46 positions shown, 14 images · IV contrast (agent unspecified)
Comparison: None Available.

CLINICAL DATA: Unintended weight loss.  Gastroparesis.

EXAM:
CT CHEST, ABDOMEN, AND PELVIS WITH CONTRAST
TECHNIQUE: Multidetector CT imaging of the chest, abdomen and pelvis was
performed following the standard protocol during bolus
administration of intravenous contrast.

[Series 2: abd pelvis 5.00 · axial · 0.65mm/px · z∈[-1649,-1349]mm · 5 of 92 slices shown, 10 images]
[im 16/92  soft-tissue]
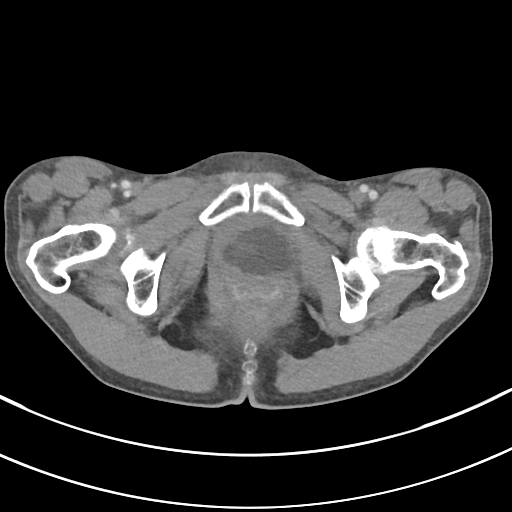
[im 16/92  bone]
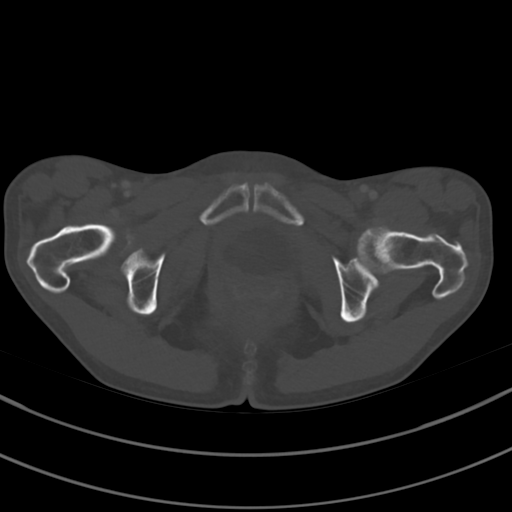
[im 31/92  soft-tissue]
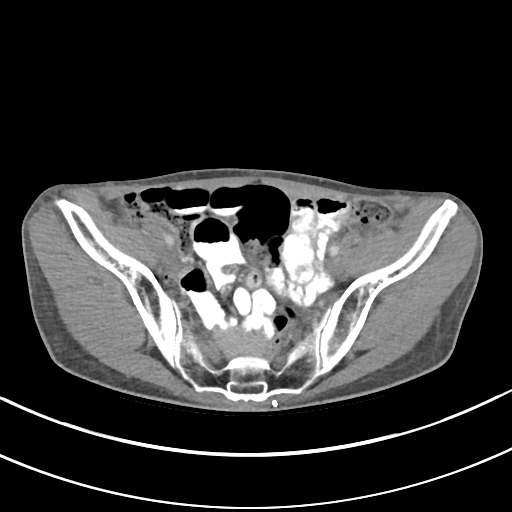
[im 31/92  lung]
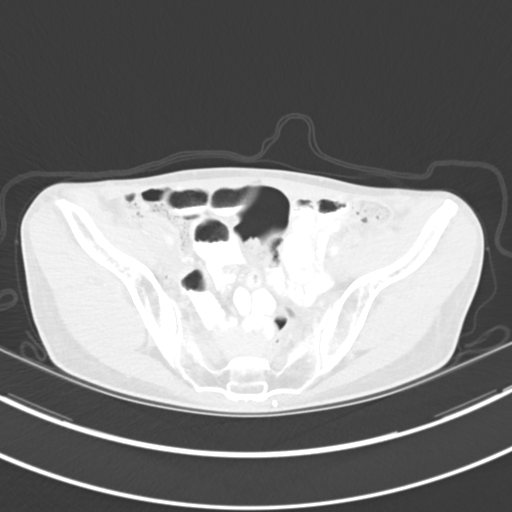
[im 46/92  soft-tissue]
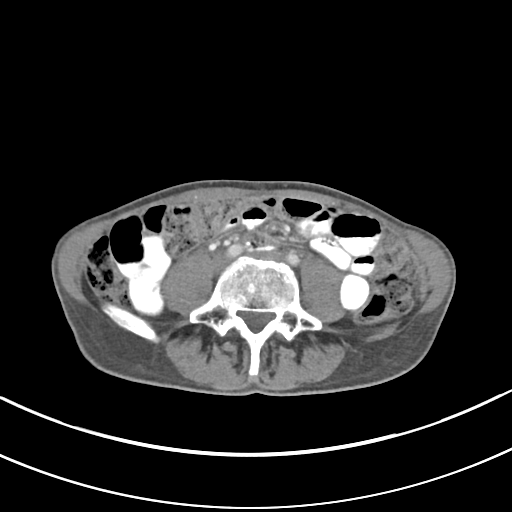
[im 46/92  lung]
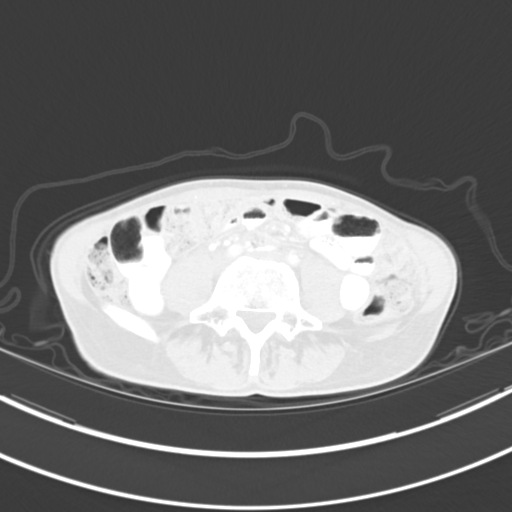
[im 61/92  soft-tissue]
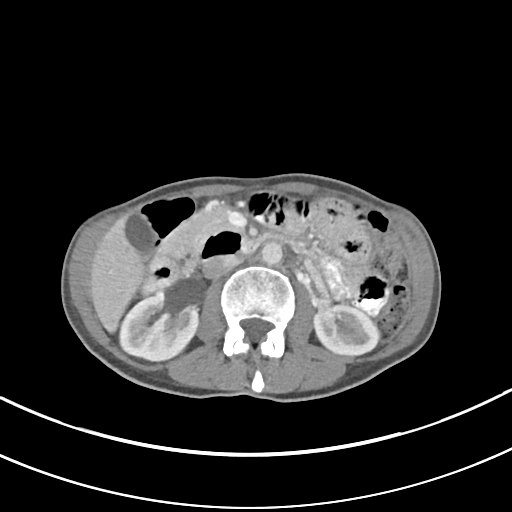
[im 61/92  lung]
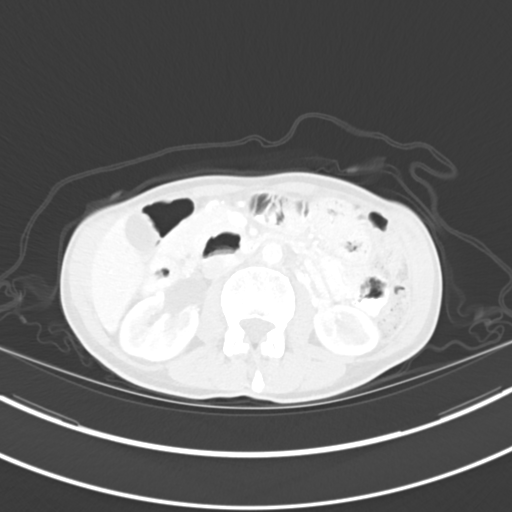
[im 76/92  soft-tissue]
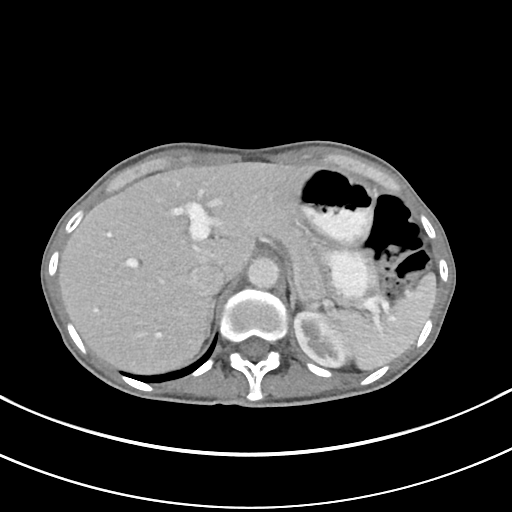
[im 76/92  lung]
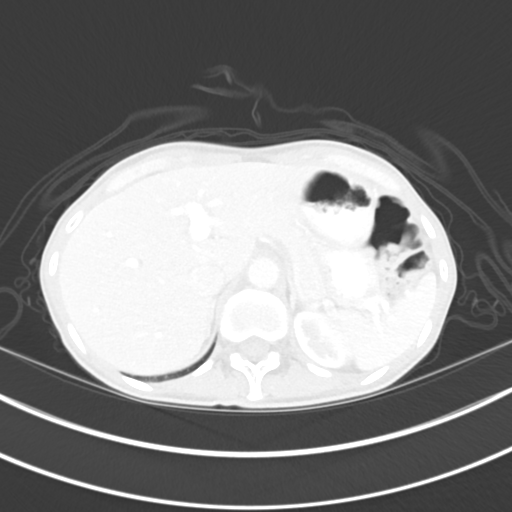

[Series 4: coronals abd pelvis 2.00 cor · coronal · 0.65mm/px · 3 of 109 slices shown, 4 images]
[im 28/109  soft-tissue]
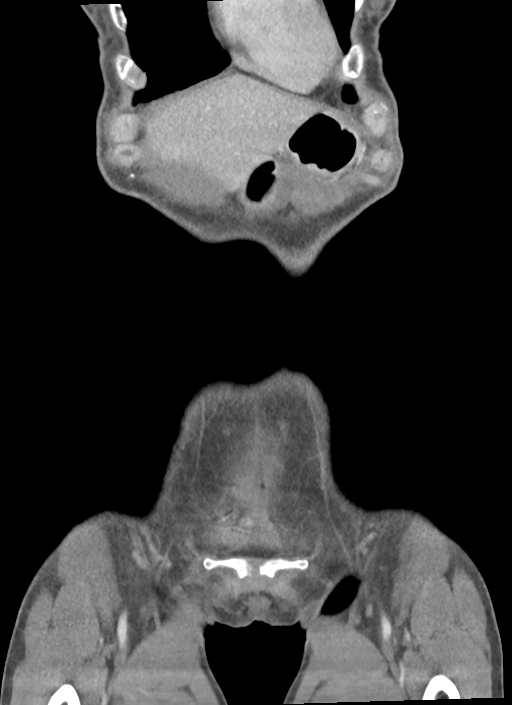
[im 55/109  soft-tissue]
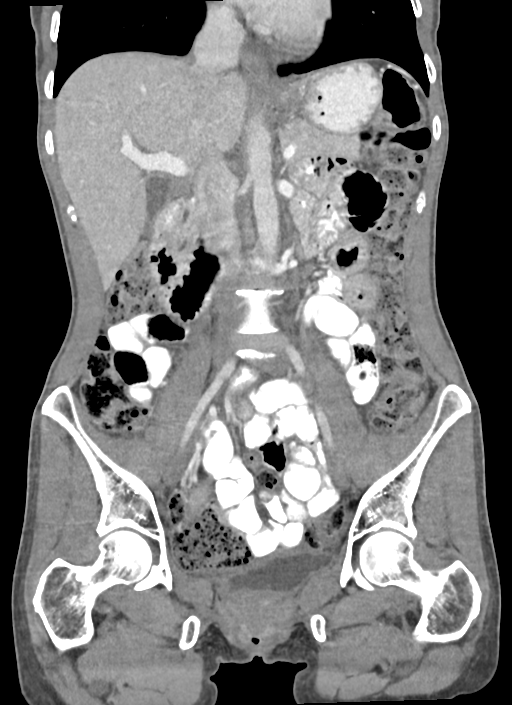
[im 55/109  bone]
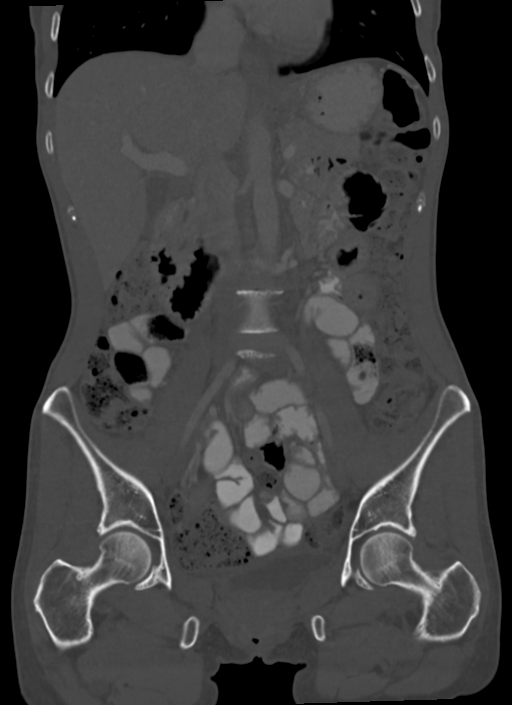
[im 82/109  soft-tissue]
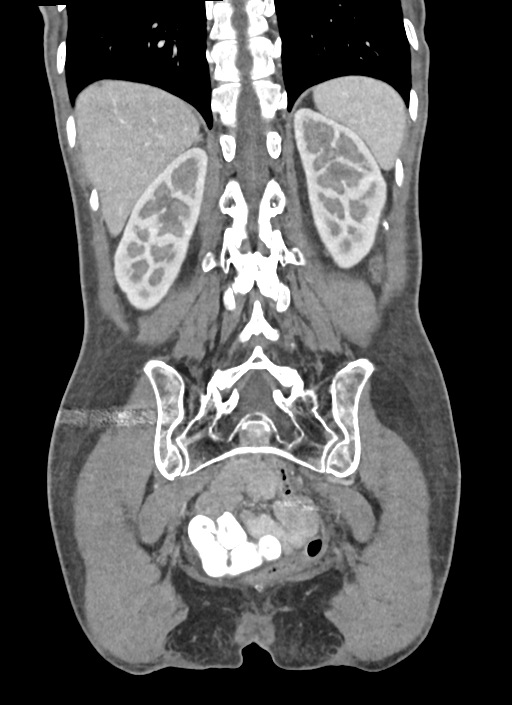

[8 of 46 positions shown; findings below may reference images not displayed]

Initially only the abdomen and pelvis for inadvertently scanned, the
patient returned for chest imaging.

RADIATION DOSE REDUCTION: This exam was performed according to the
departmental dose-optimization program which includes automated
exposure control, adjustment of the mA and/or kV according to
patient size and/or use of iterative reconstruction technique.

CONTRAST:  60mL OMNIPAQUE IOHEXOL 300 MG/ML  SOLN
FINDINGS: CT CHEST FINDINGS

Cardiovascular: The thoracic aorta is normal in caliber. The left
vertebral artery arises directly from the thoracic aorta, normal
variant anatomy. No dissection or acute aortic findings. Exam not
tailored for pulmonary artery assessment, allowing for this there is
no pulmonary embolus. The heart is normal in size. There is no
pericardial effusion.

Mediastinum/Nodes: No enlarged mediastinal or hilar lymph nodes. No
esophageal wall thickening. No thyroid nodule.

Lungs/Pleura: Minimal biapical pleuroparenchymal scarring. No focal
airspace disease. No pulmonary mass or suspicious nodule. No pleural
fluid. The trachea and central bronchi are patent.

Musculoskeletal: There are no acute or suspicious osseous
abnormalities. No chest wall soft tissue abnormalities.

CT ABDOMEN PELVIS FINDINGS

Hepatobiliary: Well-circumscribed 2.1 cm cyst in the right hepatic
dome. There is an additional scattered subcentimeter low-density
lesions that are too small to characterize but likely cyst. No
dedicated further follow-up recommended. Gallbladder physiologically
distended, no calcified stone. No biliary dilatation.

Pancreas: Unremarkable. No pancreatic ductal dilatation or
surrounding inflammatory changes. No pancreatic mass.

Spleen: Normal in size without focal abnormality.

Adrenals/Urinary Tract: Normal adrenal glands. Extrarenal pelvis
configuration of the right kidney. There is no hydronephrosis.
Homogeneous renal enhancement. No renal calculi or focal lesion.
There is symmetric excretion on delayed phase imaging. The urinary
bladder is only minimally distended, unremarkable for degree of
distension.

Stomach/Bowel: The stomach is physiologically distended, no abnormal
gastric distension. Slight distension of the duodenum with
transition as it courses under the superior mesenteric artery,
however normal SMA angle of 32 degrees. Short segment of jejunum in
the left abdomen demonstrates wall thickening, series 2, image 40,
but no associated perienteric inflammation or mesenteric edema.
Remaining small bowel is unremarkable. No obstruction. Enteric
contrast reaches the distal ileum. Normal appendix. Low lying cecum
in the deep pelvis. Moderate volume of colonic stool. There is no
colonic wall thickening, pericolonic edema or evidence of colonic
mass. No significant diverticular disease. No abnormal rectal
distention.

Vascular/Lymphatic: Normal caliber abdominal aorta. Circumaortic
left renal vein. Patent portal, splenic, and mesenteric veins. No
acute vascular findings. No abdominopelvic adenopathy.

Reproductive: Hysterectomy, no adnexal mass.

Other: Pre sacral stimulator with battery pack on the right, lead
terminating left anterior to the sacrum. No ascites. No
abdominopelvic collection. No omental thickening. There is no
abdominal wall hernia.

Musculoskeletal: There are no acute or suspicious osseous
abnormalities. Incidental bone islands in the pelvis and proximal
femora.
IMPRESSION: 1. No evidence of malignancy in the chest, abdomen, or pelvis.
2. Short segment of jejunum in the left abdomen demonstrates wall
thickening, but no associated perienteric inflammation or mesenteric
edema. This is nonspecific, lack of adjacent inflammation argues
against focal enteritis.
3. Moderate colonic stool burden, can be seen with constipation.
4. Right hepatic cyst. Additional subcentimeter low-density lesions
in the liver are too small to characterize but likely cyst. No
dedicated further follow-up recommended.

## 2024-03-27 ENCOUNTER — Ambulatory Visit: Payer: Self-pay | Admitting: Oncology

## 2024-03-27 ENCOUNTER — Inpatient Hospital Stay

## 2024-03-27 ENCOUNTER — Inpatient Hospital Stay: Attending: Oncology | Admitting: Oncology

## 2024-03-27 ENCOUNTER — Encounter: Payer: Self-pay | Admitting: Oncology

## 2024-03-27 VITALS — BP 101/66 | HR 62 | Temp 98.2°F | Resp 18 | Wt 90.0 lb

## 2024-03-27 DIAGNOSIS — D691 Qualitative platelet defects: Secondary | ICD-10-CM

## 2024-03-27 DIAGNOSIS — Z79899 Other long term (current) drug therapy: Secondary | ICD-10-CM | POA: Diagnosis not present

## 2024-03-27 DIAGNOSIS — K3184 Gastroparesis: Secondary | ICD-10-CM | POA: Insufficient documentation

## 2024-03-27 DIAGNOSIS — M069 Rheumatoid arthritis, unspecified: Secondary | ICD-10-CM | POA: Diagnosis not present

## 2024-03-27 DIAGNOSIS — Z90710 Acquired absence of both cervix and uterus: Secondary | ICD-10-CM | POA: Diagnosis not present

## 2024-03-27 DIAGNOSIS — Z79890 Hormone replacement therapy (postmenopausal): Secondary | ICD-10-CM | POA: Diagnosis not present

## 2024-03-27 LAB — CBC WITH DIFFERENTIAL/PLATELET
Abs Immature Granulocytes: 0.06 K/uL (ref 0.00–0.07)
Basophils Absolute: 0 K/uL (ref 0.0–0.1)
Basophils Relative: 0 %
Eosinophils Absolute: 0 K/uL (ref 0.0–0.5)
Eosinophils Relative: 0 %
HCT: 36.9 % (ref 36.0–46.0)
Hemoglobin: 12.9 g/dL (ref 12.0–15.0)
Immature Granulocytes: 1 %
Lymphocytes Relative: 21 %
Lymphs Abs: 1 K/uL (ref 0.7–4.0)
MCH: 30.4 pg (ref 26.0–34.0)
MCHC: 35 g/dL (ref 30.0–36.0)
MCV: 86.8 fL (ref 80.0–100.0)
Monocytes Absolute: 0.6 K/uL (ref 0.1–1.0)
Monocytes Relative: 13 %
Neutro Abs: 3.1 K/uL (ref 1.7–7.7)
Neutrophils Relative %: 65 %
Platelets: 195 K/uL (ref 150–400)
RBC: 4.25 MIL/uL (ref 3.87–5.11)
RDW: 12.3 % (ref 11.5–15.5)
WBC: 4.8 K/uL (ref 4.0–10.5)
nRBC: 0 % (ref 0.0–0.2)

## 2024-03-27 LAB — PLATELET FUNCTION ASSAY
Collagen / ADP: 90 s (ref 0–118)
Collagen / Epinephrine: 128 s (ref 0–193)

## 2024-03-27 LAB — FOLATE: Folate: >20 ng/mL (ref >5.9)

## 2024-03-27 LAB — FERRITIN: Ferritin: 60 ng/mL (ref 11–307)

## 2024-03-27 LAB — VITAMIN B12: Vitamin B-12: 514 pg/mL (ref 180–914)

## 2024-03-27 LAB — IRON AND TIBC
Iron: 78 ug/dL (ref 28–170)
Saturation Ratios: 22 % (ref 10.4–31.8)
TIBC: 363 ug/dL (ref 250–450)
UIBC: 285 ug/dL

## 2024-03-27 NOTE — Assessment & Plan Note (Signed)
 Likely multifactorial, due to IVIG and pyridostigmine.  Side effects. Von Willebrand panel is not consistent with von Willebrand deficiency. Other differential diagnosis is congenital platelet disorders. Patient has normal active bleeding events.  Repeat platelet function testing has been normal.  I will hold off additional testing.

## 2024-03-27 NOTE — Progress Notes (Signed)
 Hematology/Oncology Consult note [phone] [phone]        REFERRING PROVIDER: Zipkin, Daniella A, MD   CHIEF COMPLAINTS/REASON FOR VISIT:  Evaluation of abnormal platelet function.    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  Side effects. Von Willebrand panel is not consistent with von Willebrand deficiency. Other differential diagnosis is congenital platelet disorders. Patient has normal active bleeding events.  Repeat platelet function testing has been normal.  I will hold off additional testing.   Orders Placed This Encounter  Procedures   Platelet function assay    Standing Status:   Future    Number of Occurrences:   1    Expected Date:   03/27/2024    Expiration Date:   06/25/2024   CBC with Differential/Platelet    Standing Status:   Future    Number of Occurrences:   1    Expected Date:   03/27/2024    Expiration Date:   06/25/2024   Iron and TIBC    Standing Status:   Future    Number of Occurrences:   1    Expected Date:   03/27/2024    Expiration Date:   06/25/2024   Ferritin    Standing Status:   Future    Number of Occurrences:   1    Expected Date:   03/27/2024    Expiration Date:   06/25/2024   Folate    Standing Status:   Future    Number of Occurrences:   1    Expected Date:   03/27/2024    Expiration Date:   06/25/2024   Vitamin B12    Standing Status:   Future    Number of Occurrences:   1    Expected Date:   03/27/2024    Expiration Date:   06/25/2024    All questions were answered. The patient knows to call the clinic with any problems, questions or concerns.  Zelphia Cap, MD, PhD [facility] Hematology Oncology 03/27/2024   HISTORY OF PRESENTING ILLNESS:   Vanessa Gillespie is a  58 y.o.  female with PMH listed below was seen in consultation at the request of  Zipkin, Daniella A, MD  for evaluation of easy bruising,abnormal platelet function  testing.   Discussed the use of AI scribe software for clinical note transcription with the patient, who gave verbal consent to proceed.   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 Since starting IVIG, she has experienced easy bruising and bleeding. The issue was first noticed when her son's puppy scratched her, resulting in prolonged bleeding. She also developed unexplained bruises on her arms. Additionally, she experienced significant bleeding after a minor in-office procedure, despite not being on blood thinners. She is also on pyridostigmine.   She has been on estrogen replacement therapy for over 20 years following a total hysterectomy due to endometriosis. She recently started using an estrogen cream due to vaginal dryness. She reports she has been on the same dosage of estrogen replacement.  She started Humira for rheumatoid arthritis the day before the visit, but the initial dose was not fully administered due to a misfire of the injection device. She has been on pyridostigmine for two years for stomach motility issues. She also has a history of gastritis, which prevents her from taking NSAIDs.  No gum bleeding, epistaxis, unintentional weight loss, or night sweats.  She has a history of intermittent anemia, and her iron levels have been checked previously. She has not taken urea for hyponatremia in two years due to constipation issues.  Patient's primary care provider checked some workup.  Patient has normal PT and PTT, elevated factor VIII level, von Willebrand antigen level, von Willebrand activity.  Platelet function testing showed increased collagen/ADP deprivation  MEDICAL HISTORY:  Past Medical History:  Diagnosis Date   Hashimoto's disease 2010   Hyponatremia    Hypothyroidism    Pelvic floor dysfunction     SURGICAL HISTORY: Past Surgical History:  Procedure Laterality Date   ABDOMINAL HYSTERECTOMY      COLONOSCOPY WITH ESOPHAGOGASTRODUODENOSCOPY (EGD)     COLONOSCOPY WITH PROPOFOL  N/A 12/16/2018   Procedure: COLONOSCOPY WITH PROPOFOL ;  Surgeon: Therisa Bi, MD;  Location: [facility] ENDOSCOPY;  Service: Gastroenterology;  Laterality: N/A;   LASER LAPAROSCOPY     TONSILLECTOMY     TUBAL LIGATION      SOCIAL HISTORY: Social History   Socioeconomic History   Marital status: Married    Spouse name: Not on file   Number of children: Not on file   Years of education: Not on file   Highest education level: Not on file  Occupational History   Not on file  Tobacco Use   Smoking status: Never    Passive exposure: Never   Smokeless tobacco: Never  Substance and Sexual Activity   Alcohol use: Not on file   Drug use: Not on file   Sexual activity: Not on file  Other Topics Concern   Not on file  Social History Narrative   Not on file   Social Drivers of Health   Financial Resource Strain: Low Risk  (03/27/2024)   Overall Financial Resource Strain (CARDIA)    Difficulty of Paying Living Expenses: Not very hard  Food Insecurity: No Food Insecurity (03/27/2024)   Hunger Vital Sign    Worried About Running Out of Food in the Last Year: Never true    Ran Out of Food in the Last Year: Never true  Transportation Needs: No Transportation Needs (03/27/2024)   PRAPARE - Administrator, Civil Service (Medical): No    Lack of Transportation (Non-Medical): No  Physical Activity: Not on file  Stress: Stress Concern Present (03/27/2024)   [employer] of Occupational Health - Occupational Stress Questionnaire    Feeling of Stress: To some extent  Social Connections: Not on file  Intimate Partner Violence: Not At Risk (03/27/2024)   Humiliation, Afraid, Rape, and Kick questionnaire    Fear of Current or Ex-Partner: No    Emotionally Abused: No    Physically Abused: No    Sexually Abused: No    FAMILY HISTORY: Family History  Problem Relation Age of Onset   Hashimoto's  thyroiditis Mother    Hypertension Father    Cancer Father    Cancer Maternal Grandmother     ALLERGIES:  is allergic to elemental sulfur, sulfa antibiotics, sulfur, and sulfamethoxazole-trimethoprim.  MEDICATIONS:  Current Outpatient Medications  Medication Sig Dispense Refill   HUMIRA, 2 PEN, 40 MG/0.4ML pen Inject 0.4 mLs into the skin.     polyethylene glycol-electrolytes (NULYTELY) 420 g solution Drink one 8 oz glass of mixture every 15 minutes until you finish the jug. 4000 mL 0   Aloe Vera 25 MG CAPS Take 1 capsule by mouth 1 day or 1 dose.     baclofen (LIORESAL) 10 MG tablet Take 10 mg by mouth  3 (three) times daily.     diazepam (VALIUM) 10 MG tablet Insert one daily into the vagina as needed for pain. NOT for ORAL USE     estradiol (ESTRACE) 0.1 MG/GM vaginal cream Place 1 Applicatorful vaginally once a week.     estradiol (VIVELLE-DOT) 0.1 MG/24HR patch Place 0.1 mg onto the skin.     gentamicin ointment (GARAMYCIN) 0.1 %      hydrOXYzine (ATARAX) 10 MG tablet Take 1 tablet by mouth daily.     hydrOXYzine (VISTARIL) 25 MG capsule Take 1 capsule by mouth as needed.     levothyroxine (SYNTHROID) 50 MCG tablet Take by mouth.     magnesium hydroxide (MILK OF MAGNESIA) 400 MG/5ML suspension Take 5 mLs by mouth as needed.     MOTEGRITY  2 MG TABS Take 1 tablet (2 mg total) by mouth daily. 90 tablet 1   Nutritional Supplements (BOOST VERY HIGH CALORIE) LIQD Take by mouth.     omeprazole  (PRILOSEC) 20 MG capsule Take 1 capsule by mouth once daily 90 capsule 0   ondansetron  (ZOFRAN ) 4 MG tablet Take 1 tablet (4 mg total) by mouth every 6 (six) hours as needed for nausea or vomiting. 30 tablet 1   Pediatric Multivitamins-Iron (FLINTSTONES COMPLETE) 10 MG CHEW Chew by mouth.     PRESCRIPTION MEDICATION Take 1 tablet by mouth daily. domperidone 10 MG     pyridostigmine (MESTINON) 60 MG tablet Take 1 tablet by mouth in the morning, at noon, and at bedtime.     sucralfate  (CARAFATE ) 1  GM/10ML suspension Take 10 mLs (1 g total) by mouth 4 (four) times daily. 360 mL 11   urea (URE-NA) 15 g PACK oral packet Take 1 packet by mouth at bedtime.     No current facility-administered medications for this visit.    Review of Systems  Constitutional:  Negative for appetite change, chills, fatigue and fever.  HENT:   Negative for hearing loss and voice change.   Eyes:  Negative for eye problems.  Respiratory:  Negative for chest tightness and cough.   Cardiovascular:  Negative for chest pain.  Gastrointestinal:  Negative for abdominal distention, abdominal pain and blood in stool.  Endocrine: Negative for hot flashes.  Genitourinary:  Negative for difficulty urinating and frequency.   Musculoskeletal:  Negative for arthralgias.  Skin:  Negative for itching and rash.  Neurological:  Negative for extremity weakness.  Hematological:  Negative for adenopathy. Bruises/bleeds easily.  Psychiatric/Behavioral:  Negative for confusion.    PHYSICAL EXAMINATION:  Vitals:   03/27/24 1121  BP: 101/66  Pulse: 62  Resp: 18  Temp: 98.2 F (36.8 C)  SpO2: 100%   Filed Weights   03/27/24 1121  Weight: 90 lb (40.8 kg)    Physical Exam Constitutional:      General: She is not in acute distress. HENT:     Head: Normocephalic and atraumatic.  Eyes:     General: No scleral icterus. Cardiovascular:     Rate and Rhythm: Normal rate and regular rhythm.     Heart sounds: Normal heart sounds.  Pulmonary:     Effort: Pulmonary effort is normal. No respiratory distress.     Breath sounds: No wheezing.  Abdominal:     General: Bowel sounds are normal. There is no distension.     Palpations: Abdomen is soft.  Musculoskeletal:        General: No deformity. Normal range of motion.     Cervical back: Normal range of  motion and neck supple.  Skin:    General: Skin is warm and dry.     Findings: No erythema or rash.  Neurological:     Mental Status: She is alert and oriented to person,  place, and time. Mental status is at baseline.     Cranial Nerves: No cranial nerve deficit.     Coordination: Coordination normal.  Psychiatric:        Mood and Affect: Mood normal.     LABORATORY DATA:  I have reviewed the data as listed    Latest Ref Rng & Units 03/27/2024   12:17 PM 11/20/2022    4:33 PM 09/19/2022    4:07 PM  CBC  WBC 4.0 - 10.5 K/uL 4.8  4.7  4.9   Hemoglobin 12.0 - 15.0 g/dL 87.0  87.9  87.9   Hematocrit 36.0 - 46.0 % 36.9  35.3  34.6   Platelets 150 - 400 K/uL 195  183  171       Latest Ref Rng & Units 11/26/2023    4:54 PM 11/22/2023    2:21 PM 08/22/2023    2:11 PM  CMP  Glucose 70 - 99 mg/dL 93   75   BUN 6 - 24 mg/dL 17   13   Creatinine 9.42 - 1.00 mg/dL 9.37   9.39   Sodium 865 - 144 mmol/L 131  130  133   Potassium 3.5 - 5.2 mmol/L 4.2   4.2   Chloride 96 - 106 mmol/L 95   96   CO2 20 - 29 mmol/L 22   27   Calcium 8.7 - 10.2 mg/dL 9.1   9.1   Total Protein 6.0 - 8.5 g/dL 7.3   6.6   Total Bilirubin 0.0 - 1.2 mg/dL <9.7   <9.7   Alkaline Phos 44 - 121 IU/L 43   39   AST 0 - 40 IU/L 45   35   ALT 0 - 32 IU/L 51   29       RADIOGRAPHIC STUDIES: I have personally reviewed the radiological images as listed and agreed with the findings in the report. No results found.

## 2024-07-23 ENCOUNTER — Other Ambulatory Visit: Payer: Self-pay | Admitting: Gastroenterology

## 2024-07-23 DIAGNOSIS — R109 Unspecified abdominal pain: Secondary | ICD-10-CM

## 2024-07-30 ENCOUNTER — Ambulatory Visit
Admission: RE | Admit: 2024-07-30 | Discharge: 2024-07-30 | Disposition: A | Discharge status: Home / Self Care | Source: Ambulatory Visit | Attending: Gastroenterology | Admitting: Gastroenterology

## 2024-07-30 DIAGNOSIS — R109 Unspecified abdominal pain: Secondary | ICD-10-CM | POA: Insufficient documentation

## 2024-07-31 ENCOUNTER — Ambulatory Visit: Payer: Self-pay | Admitting: Gastroenterology

## 2024-07-31 ENCOUNTER — Ambulatory Visit
